# Patient Record
Sex: Female | Born: 1990 | Race: White | Hispanic: No | Marital: Single | Smoking: Never smoker
Health system: Southern US, Community
[De-identification: ages and names within clinical notes are randomized; demographics above are authoritative.]

## PROBLEM LIST (undated history)

## (undated) DIAGNOSIS — F419 Anxiety disorder, unspecified: Secondary | ICD-10-CM

## (undated) DIAGNOSIS — J383 Other diseases of vocal cords: Secondary | ICD-10-CM

## (undated) DIAGNOSIS — T7840XA Allergy, unspecified, initial encounter: Secondary | ICD-10-CM

## (undated) HISTORY — DX: Other diseases of vocal cords: J38.3

## (undated) HISTORY — DX: Anxiety disorder, unspecified: F41.9

## (undated) HISTORY — DX: Allergy, unspecified, initial encounter: T78.40XA

---
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
## 2011-07-06 ENCOUNTER — Encounter: Payer: Self-pay | Admitting: Family Medicine

## 2011-07-06 ENCOUNTER — Encounter (INDEPENDENT_AMBULATORY_CARE_PROVIDER_SITE_OTHER): Payer: PRIVATE HEALTH INSURANCE | Admitting: Family Medicine

## 2011-07-06 DIAGNOSIS — Z23 Encounter for immunization: Secondary | ICD-10-CM

## 2011-07-06 DIAGNOSIS — R5383 Other fatigue: Secondary | ICD-10-CM

## 2011-07-06 DIAGNOSIS — Z0000 Encounter for general adult medical examination without abnormal findings: Secondary | ICD-10-CM

## 2011-07-06 DIAGNOSIS — Z01419 Encounter for gynecological examination (general) (routine) without abnormal findings: Secondary | ICD-10-CM

## 2011-07-06 DIAGNOSIS — J301 Allergic rhinitis due to pollen: Secondary | ICD-10-CM

## 2011-07-10 ENCOUNTER — Other Ambulatory Visit (INDEPENDENT_AMBULATORY_CARE_PROVIDER_SITE_OTHER): Payer: PRIVATE HEALTH INSURANCE

## 2011-07-10 DIAGNOSIS — Z130 Encounter for screening for diseases of the blood and blood-forming organs and certain disorders involving the immune mechanism: Secondary | ICD-10-CM

## 2011-07-10 DIAGNOSIS — R5381 Other malaise: Secondary | ICD-10-CM

## 2011-07-10 DIAGNOSIS — R5383 Other fatigue: Secondary | ICD-10-CM

## 2011-09-04 ENCOUNTER — Ambulatory Visit (INDEPENDENT_AMBULATORY_CARE_PROVIDER_SITE_OTHER): Payer: PRIVATE HEALTH INSURANCE | Admitting: Physician Assistant

## 2011-09-04 DIAGNOSIS — Z23 Encounter for immunization: Secondary | ICD-10-CM

## 2011-09-04 NOTE — Progress Notes (Signed)
  Subjective:    Patient ID: Jasmine Barker, female    DOB: 19-Jan-1991, 21 y.o.   MRN: [id number]  HPI Here for Gardasil #2.   Review of Systems     Objective:   Physical Exam        Assessment & Plan:  RTC 4 months for Gardasil #3.

## 2011-12-10 ENCOUNTER — Ambulatory Visit (INDEPENDENT_AMBULATORY_CARE_PROVIDER_SITE_OTHER): Payer: PRIVATE HEALTH INSURANCE | Admitting: Internal Medicine

## 2011-12-10 ENCOUNTER — Encounter: Payer: Self-pay | Admitting: Internal Medicine

## 2011-12-10 DIAGNOSIS — Z23 Encounter for immunization: Secondary | ICD-10-CM

## 2011-12-10 MED ORDER — HPV QUADRIVALENT VACCINE IM SUSP
0.5000 mL | Freq: Once | INTRAMUSCULAR | Status: AC
  Administered 2011-12-10: 0.5 mL via INTRAMUSCULAR

## 2011-12-10 MED ORDER — HPV QUADRIVALENT VACCINE IM SUSP: 0.5000 mL | Freq: Once | INTRAMUSCULAR | Status: DC

## 2011-12-10 NOTE — Progress Notes (Signed)
Addended by: Glennie Isle on: 12/10/2011 06:01 PM   Modules accepted: Orders

## 2011-12-10 NOTE — Patient Instructions (Signed)
You have competed your immunization for gardasil

## 2011-12-10 NOTE — Progress Notes (Signed)
  Subjective:    Patient ID: Jasmine Barker, female    DOB: [birth year], 21 y.o.   MRN: [id number]  HPIneeds 3rd gardisil immunization No complaints. Is using condoms and birth control pills and practices safe sex    Review of Systems  Constitutional: Negative.   HENT: Negative.   Eyes: Negative.   Respiratory: Negative.   Cardiovascular: Negative.   Gastrointestinal: Negative.   Genitourinary: Negative.   Musculoskeletal: Negative.   Skin: Negative.   Neurological: Negative.   Hematological: Negative.   Psychiatric/Behavioral: Negative.   All other systems reviewed and are negative.       Objective:   Physical Exam  Nursing note and vitals reviewed. Constitutional: She is oriented to person, place, and time. She appears well-developed and well-nourished.  HENT:  Head: Normocephalic and atraumatic.  Right Ear: External ear normal.  Left Ear: External ear normal.  Eyes: Conjunctivae and EOM are normal. Pupils are equal, round, and reactive to light.  Neck: Normal range of motion. Neck supple.  Cardiovascular: Normal rate, regular rhythm and normal heart sounds.   Pulmonary/Chest: Effort normal and breath sounds normal.  Abdominal: Soft. Bowel sounds are normal.  Musculoskeletal: Normal range of motion.  Neurological: She is alert and oriented to person, place, and time. She has normal reflexes.  Skin: Skin is warm and dry.  Psychiatric: She has a normal mood and affect. Her behavior is normal. Judgment and thought content normal.      gardisil injection #3    Assessment & Plan:

## 2012-07-04 ENCOUNTER — Encounter: Payer: PRIVATE HEALTH INSURANCE | Admitting: Family Medicine

## 2012-08-12 ENCOUNTER — Telehealth: Payer: Self-pay | Admitting: Internal Medicine

## 2012-08-12 NOTE — Telephone Encounter (Signed)
Pt mother states she does not have a primary care doctor and to disregard.

## 2012-08-12 NOTE — Telephone Encounter (Signed)
We received results of recent allelrgy testing ,  But patient has not been seen by Dr Darrick Huntsman since the move August 2012.  Does she have another physician we can send the results to?

## 2012-09-16 ENCOUNTER — Encounter: Payer: Self-pay | Admitting: Family Medicine

## 2013-07-02 HISTORY — PX: MICROLARYNGOSCOPY: SHX5208

## 2013-12-28 ENCOUNTER — Ambulatory Visit (INDEPENDENT_AMBULATORY_CARE_PROVIDER_SITE_OTHER): Payer: BC Managed Care – PPO | Admitting: Physician Assistant

## 2013-12-28 VITALS — BP 102/68 | HR 73 | Temp 97.9°F | Resp 16 | Ht 63.0 in | Wt 111.0 lb

## 2013-12-28 DIAGNOSIS — R300 Dysuria: Secondary | ICD-10-CM

## 2013-12-28 DIAGNOSIS — J309 Allergic rhinitis, unspecified: Secondary | ICD-10-CM | POA: Insufficient documentation

## 2013-12-28 LAB — POCT UA - MICROSCOPIC ONLY
Casts, Ur, LPF, POC: NEGATIVE
Crystals, Ur, HPF, POC: NEGATIVE
Mucus, UA: NEGATIVE
Yeast, UA: POSITIVE

## 2013-12-28 LAB — POCT URINALYSIS DIPSTICK
Bilirubin, UA: NEGATIVE
Blood, UA: NEGATIVE
Glucose, UA: NEGATIVE
Ketones, UA: NEGATIVE
Nitrite, UA: NEGATIVE
Protein, UA: NEGATIVE
Spec Grav, UA: 1.01
Urobilinogen, UA: 0.2
pH, UA: 7

## 2013-12-28 MED ORDER — FLUCONAZOLE 150 MG PO TABS: 150.0000 mg | ORAL_TABLET | Freq: Once | ORAL | Status: DC

## 2013-12-28 NOTE — Patient Instructions (Signed)
I will contact you with your lab results as soon as they are available.   If you have not heard from me in 2 weeks, please contact me.  The fastest way to get your results is to register for My Chart (see the instructions on the last page of this printout).   

## 2013-12-28 NOTE — Progress Notes (Signed)
Subjective:    Patient ID: Jasmine Barker, female    DOB: 02/08/1991, 23 y.o.   MRN: [id number]   PCP: No primary provider on file.  Chief Complaint  Patient presents with  . Dysuria    2 weeks    Medications, allergies, past medical history, surgical history, family history, social history and problem list reviewed and updated.  Patient Active Problem List   Diagnosis Date Noted  . Allergic rhinitis 12/28/2013    Prior to Admission medications   Medication Sig Start Date End Date Taking? Authorizing Provider  Azelastine-Fluticasone (DYMISTA) 137-50 MCG/ACT SUSP Place into the nose.   Yes Historical Provider, MD    HPI  Presents with 2 weeks of urinary urgency, frequency and pressure.  Less burning than with previous UTIs she's had.  No vaginal discharge. No fever, chills, nausea, vomiting. Her mother is a physician and prescibed a course of Bactrim, but without benefit.  Recently underwent STI screening, as was her boyfriend.  All reportedly negative.  Review of Systems As above.    Objective:   Physical Exam  Constitutional: She is oriented to person, place, and time. She appears well-developed and well-nourished. No distress.  BP 102/68  Pulse 73  Temp(Src) 97.9 F (36.6 C)  Resp 16  Ht 5\' 3"  (1.6 m)  Wt 111 lb (50.349 kg)  BMI 19.67 kg/m2  SpO2 99%  LMP 12/07/2013   HENT:  Head: Normocephalic and atraumatic.  Eyes: Conjunctivae are normal. No scleral icterus.  Neck: Neck supple. No thyromegaly present.  Cardiovascular: Normal rate, regular rhythm and normal heart sounds.   Pulmonary/Chest: Effort normal and breath sounds normal.  Abdominal: Soft. Bowel sounds are normal. She exhibits no distension and no mass. There is no hepatosplenomegaly. There is no tenderness (describes the urge to urinate with palpation in the suprapubic region). There is no rebound, no guarding and no CVA tenderness.  Lymphadenopathy:    She has no cervical adenopathy.  Neurological:  She is alert and oriented to person, place, and time.  Skin: Skin is warm and dry.  Psychiatric: She has a normal mood and affect. Her behavior is normal.   Results for orders placed in visit on 12/28/13  POCT UA - MICROSCOPIC ONLY      Result Value Ref Range   WBC, Ur, HPF, POC 2-4     RBC, urine, microscopic 0-1     Bacteria, U Microscopic small     Mucus, UA neg     Epithelial cells, urine per micros 2-4     Crystals, Ur, HPF, POC neg     Casts, Ur, LPF, POC neg     Yeast, UA positive    POCT URINALYSIS DIPSTICK      Result Value Ref Range   Color, UA yellow     Clarity, UA clear     Glucose, UA neg     Bilirubin, UA neg     Ketones, UA neg     Spec Grav, UA 1.010     Blood, UA neg     pH, UA 7.0     Protein, UA neg     Urobilinogen, UA 0.2     Nitrite, UA neg     Leukocytes, UA Trace            Assessment & Plan:  1. Dysuria Likely vaginal candidiasis secondary to recent antibiotic use, but UCx pending.  Anticipatory guidance provided. Supportive care. - POCT UA - Microscopic Only - POCT  urinalysis dipstick - Urine culture - fluconazole (DIFLUCAN) 150 MG tablet; Take 1 tablet (150 mg total) by mouth once. Repeat if needed  Dispense: 2 tablet; Refill: 0   Fernande Brashelle S. Jeffery, PA-C Physician Assistant-Certified [facility], [facility]

## 2013-12-30 LAB — URINE CULTURE
Colony Count: NO GROWTH
Organism ID, Bacteria: NO GROWTH

## 2014-01-01 ENCOUNTER — Telehealth: Payer: Self-pay | Admitting: Physician Assistant

## 2014-01-01 NOTE — Telephone Encounter (Signed)
UCX is negative. If symptoms persist, let me know, otherwise, RTC PRN.

## 2014-01-22 ENCOUNTER — Encounter: Payer: Self-pay | Admitting: Family Medicine

## 2014-01-22 ENCOUNTER — Ambulatory Visit (INDEPENDENT_AMBULATORY_CARE_PROVIDER_SITE_OTHER): Payer: BC Managed Care – PPO | Admitting: Family Medicine

## 2014-01-22 VITALS — BP 105/76 | HR 82 | Temp 97.7°F | Resp 16 | Ht 63.5 in | Wt 108.0 lb

## 2014-01-22 DIAGNOSIS — N72 Inflammatory disease of cervix uteri: Secondary | ICD-10-CM

## 2014-01-22 DIAGNOSIS — N6320 Unspecified lump in the left breast, unspecified quadrant: Secondary | ICD-10-CM

## 2014-01-22 DIAGNOSIS — N6310 Unspecified lump in the right breast, unspecified quadrant: Secondary | ICD-10-CM | POA: Insufficient documentation

## 2014-01-22 DIAGNOSIS — N63 Unspecified lump in unspecified breast: Secondary | ICD-10-CM

## 2014-01-22 DIAGNOSIS — N898 Other specified noninflammatory disorders of vagina: Secondary | ICD-10-CM

## 2014-01-22 DIAGNOSIS — Z0000 Encounter for general adult medical examination without abnormal findings: Secondary | ICD-10-CM

## 2014-01-22 DIAGNOSIS — Z124 Encounter for screening for malignant neoplasm of cervix: Secondary | ICD-10-CM

## 2014-01-22 DIAGNOSIS — Z01419 Encounter for gynecological examination (general) (routine) without abnormal findings: Secondary | ICD-10-CM

## 2014-01-22 LAB — CBC WITH DIFFERENTIAL/PLATELET
Basophils Absolute: 0 10*3/uL (ref 0.0–0.1)
Basophils Relative: 1 % (ref 0–1)
Eosinophils Absolute: 0 10*3/uL (ref 0.0–0.7)
Eosinophils Relative: 1 % (ref 0–5)
HCT: 42.8 % (ref 36.0–46.0)
Hemoglobin: 15.2 g/dL — ABNORMAL HIGH (ref 12.0–15.0)
Lymphocytes Relative: 37 % (ref 12–46)
Lymphs Abs: 1.5 10*3/uL (ref 0.7–4.0)
MCH: 32.6 pg (ref 26.0–34.0)
MCHC: 35.5 g/dL (ref 30.0–36.0)
MCV: 91.8 fL (ref 78.0–100.0)
Monocytes Absolute: 0.3 10*3/uL (ref 0.1–1.0)
Monocytes Relative: 7 % (ref 3–12)
Neutro Abs: 2.2 10*3/uL (ref 1.7–7.7)
Neutrophils Relative %: 54 % (ref 43–77)
Platelets: 190 10*3/uL (ref 150–400)
RBC: 4.66 MIL/uL (ref 3.87–5.11)
RDW: 12.4 % (ref 11.5–15.5)
WBC: 4 10*3/uL (ref 4.0–10.5)

## 2014-01-22 LAB — POCT WET PREP WITH KOH
KOH Prep POC: NEGATIVE
Trichomonas, UA: NEGATIVE
Yeast Wet Prep HPF POC: NEGATIVE

## 2014-01-22 LAB — POCT URINALYSIS DIPSTICK
Bilirubin, UA: NEGATIVE
Blood, UA: NEGATIVE
Glucose, UA: NEGATIVE
Ketones, UA: NEGATIVE
Nitrite, UA: NEGATIVE
Protein, UA: NEGATIVE
Spec Grav, UA: 1.01
Urobilinogen, UA: 0.2
pH, UA: 7

## 2014-01-22 MED ORDER — METRONIDAZOLE 0.75 % VA GEL: 1.0000 | Freq: Two times a day (BID) | VAGINAL | Status: AC

## 2014-01-22 NOTE — Patient Instructions (Signed)
Keeping You Healthy  Get These Tests 1. Blood Pressure- Have your blood pressure checked once a year by your health care provider.  Normal blood pressure is 120/80. 2. Weight- Have your body mass index (BMI) calculated to screen for obesity.  BMI is measure of body fat based on height and weight.  You can also calculate your own BMI at www.nhlbisupport.com/bmi/. 3. Cholesterol- Have your cholesterol checked every 5 years starting at age 24 then yearly starting at age 45. 4. Chlamydia, HIV, and other sexually transmitted diseases- Get screened every year until age 25, then within three months of each new sexual provider. 5. Pap Smear- Every 1-3 years; discuss with your health care provider. 6. Mammogram- Every year starting at age 40  Take these medicines  Calcium with Vitamin D-Your body needs 1200 mg of Calcium each day and 800-1000 IU of Vitamin D daily.  Your body can only absorb 500 mg of Calcium at a time so Calcium must be taken in 2 or 3 divided doses throughout the day.  Multivitamin with folic acid- Once daily if it is possible for you to become pregnant.  Get these Immunizations  Gardasil-Series of three doses; prevents HPV related illness such as genital warts and cervical cancer.  Menactra-Single dose; prevents meningitis.  Tetanus shot- Every 10 years.  Flu shot-Every year.  Take these steps 1. Do not smoke-Your healthcare provider can help you quit.  For tips on how to quit go to www.smokefree.gov or call 1-800 QUITNOW. 2. Be physically active- Exercise 5 days a week for at least 30 minutes.  If you are not already physically active, start slow and gradually work up to 30 minutes of moderate physical activity.  Examples of moderate activity include walking briskly, dancing, swimming, bicycling, etc. 3. Breast Cancer- A self breast exam every month is important for early detection of breast cancer.  For more information and instruction on self breast exams, ask your  healthcare provider or www.womenshealth.gov/faq/breast-self-exam.cfm. 4. Eat a healthy diet- Eat a variety of healthy foods such as fruits, vegetables, whole grains, low fat milk, low fat cheeses, yogurt, lean meats, poultry and fish, beans, nuts, tofu, etc.  For more information go to www. Thenutritionsource.org 5. Drink alcohol in moderation- Limit alcohol intake to one drink or less per day. Never drink and drive. 6. Depression- Your emotional health is as important as your physical health.  If you're feeling down or losing interest in things you normally enjoy please talk to your healthcare provider about being screened for depression. 7. Dental visit- Brush and floss your teeth twice daily; visit your dentist twice a year. 8. Eye doctor- Get an eye exam at least every 2 years. 9. Helmet use- Always wear a helmet when riding a bicycle, motorcycle, rollerblading or skateboarding. 10. Safe sex- If you may be exposed to sexually transmitted infections, use a condom. 11. Seat belts- Seat belts can save your live; always wear one. 12. Smoke/Carbon Monoxide detectors- These detectors need to be installed on the appropriate level of your home. Replace batteries at least once a year. 13. Skin cancer- When out in the sun please cover up and use sunscreen 15 SPF or higher. 14. Violence- If anyone is threatening or hurting you, please tell your healthcare provider.  Bacterial Vaginosis Bacterial vaginosis is a vaginal infection that occurs when the normal balance of bacteria in the vagina is disrupted. It results from an overgrowth of certain bacteria. This is the most common vaginal infection in women of   childbearing age. Treatment is important to prevent complications, especially in pregnant women, as it can cause a premature delivery. CAUSES  Bacterial vaginosis is caused by an increase in harmful bacteria that are normally present in smaller amounts in the vagina. Several different kinds of bacteria can  cause bacterial vaginosis. However, the reason that the condition develops is not fully understood. RISK FACTORS Certain activities or behaviors can put you at an increased risk of developing bacterial vaginosis, including:  Having a new sex partner or multiple sex partners.  Douching.  Using an intrauterine device (IUD) for contraception. Women do not get bacterial vaginosis from toilet seats, bedding, swimming pools, or contact with objects around them. SIGNS AND SYMPTOMS  Some women with bacterial vaginosis have no signs or symptoms. Common symptoms include:  Grey vaginal discharge.  A fishlike odor with discharge, especially after sexual intercourse.  Itching or burning of the vagina and vulva.  Burning or pain with urination. DIAGNOSIS  Your health care provider will take a medical history and examine the vagina for signs of bacterial vaginosis. A sample of vaginal fluid may be taken. Your health care provider will look at this sample under a microscope to check for bacteria and abnormal cells. A vaginal pH test may also be done.  TREATMENT  Bacterial vaginosis may be treated with antibiotic medicines. These may be given in the form of a pill or a vaginal cream. A second round of antibiotics may be prescribed if the condition comes back after treatment.  HOME CARE INSTRUCTIONS   Only take over-the-counter or prescription medicines as directed by your health care provider.  If antibiotic medicine was prescribed, take it as directed. Make sure you finish it even if you start to feel better.  Do not have sex until treatment is completed.  Tell all sexual partners that you have a vaginal infection. They should see their health care provider and be treated if they have problems, such as a mild rash or itching.  Practice safe sex by using condoms and only having one sex partner. SEEK MEDICAL CARE IF:   Your symptoms are not improving after 3 days of treatment.  You have  increased discharge or pain.  You have a fever. MAKE SURE YOU:   Understand these instructions.  Will watch your condition.  Will get help right away if you are not doing well or get worse. FOR MORE INFORMATION  Centers for Disease Control and Prevention, Division of STD Prevention: www.cdc.gov/std American Sexual Health Association (ASHA): www.ashastd.org  Document Released: 06/18/2005 Document Revised: 04/08/2013 Document Reviewed: 01/28/2013 ExitCare Patient Information 2015 [employer]. This information is not intended to replace advice given to you by your health care provider. Make sure you discuss any questions you have with your health care provider.      

## 2014-01-22 NOTE — Progress Notes (Signed)
Subjective:    Patient ID: Jasmine Barker, female    DOB: [birth year], 23 y.o.   MRN: [id number]  HPI  This 23 y.o. Cauc female is here for CPE/PAP. She is accompanied by her mother (Dr. Mindi Junker) during interview. 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 Evaluated ~ 4 weeks ago at [facility]; treated for Candida vaginitis. Diflucan x 1 dose helped and pt received treatment from acupuncturist.   Menses are normal; pt is in heterosexual monogamous relationship. 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   Patient Active Problem List   Diagnosis Date Noted  . Allergic rhinitis 12/28/2013    Prior to Admission medications   Medication Sig Start Date End Date Taking? Authorizing Provider  Azelastine-Fluticasone (DYMISTA) 137-50 MCG/ACT SUSP Place into the nose.   Yes Historical Provider, MD    PMHx, Surg Hx, Soc and Fam Hx reviewed.   Review of Systems  Gastrointestinal: Positive for nausea.  Genitourinary: Positive for urgency, frequency and pelvic pain. Negative for dysuria, decreased urine volume, difficulty urinating, genital sores and menstrual problem.       Onset after vocal surgery 11/10/2013. Urethral and bladder discomfort after voiding. Sought treatment w/ acupuncturist after treatment for Candida vulvovaginitis (because problem did not feel like yeast infection); prescribed "Intestinal fungus formula' and  Acupuncture helps.  Skin: Positive for rash.  Allergic/Immunologic: Positive for environmental allergies.  All other systems reviewed and are negative.     Objective:   Physical Exam  Nursing note and vitals reviewed. Constitutional: She is oriented to person, place, and time. Vital signs are normal. She appears well-developed and well-nourished. No distress.  HENT:  Head: Normocephalic and atraumatic.  Right Ear: Hearing, tympanic membrane, external ear  and ear canal normal.  Left Ear: Hearing, tympanic membrane, external ear and ear canal normal.  Nose: Nose normal. No nasal deformity or septal deviation.  Mouth/Throat: Uvula is midline, oropharynx is clear and moist and mucous membranes are normal. No oral lesions. Normal dentition. No dental caries.  Eyes: Conjunctivae, EOM and lids are normal. Pupils are equal, round, and reactive to light. No scleral icterus.  Fundoscopic exam:      The right eye shows no papilledema.       The left eye shows no papilledema. The left eye shows red reflex.  Contacts in place.  Neck: Trachea normal, normal range of motion and full passive range of motion without pain. Neck supple. No spinous process tenderness and no muscular tenderness present. No mass and no thyromegaly present.  Cardiovascular: Normal rate, regular rhythm, S1 normal, S2 normal, normal heart sounds, intact distal pulses and normal pulses.   No extrasystoles are present. Exam reveals no gallop and no friction rub.   No murmur heard. Pulmonary/Chest: Effort normal and breath sounds normal. No respiratory distress. Right breast exhibits mass and tenderness. Right breast exhibits no inverted nipple, no nipple discharge and no skin change. Left breast exhibits mass. Left breast exhibits no inverted nipple, no nipple discharge, no skin change and no tenderness. Breasts are symmetrical.    Benign breast masses imaged in the past.  Abdominal: Soft. Normal appearance and bowel sounds are normal. She exhibits no distension and no mass. There is no tenderness. There is no rebound, no guarding and no CVA tenderness.  Genitourinary: Uterus normal. There is no rash, tenderness or lesion on the right labia. There is  no rash, tenderness or lesion on the left labia. Uterus is not deviated, not enlarged, not fixed and not tender. Cervix exhibits discharge and friability. Cervix exhibits no motion tenderness. Right adnexum displays no mass, no tenderness and no  fullness. Left adnexum displays tenderness. Left adnexum displays no mass and no fullness. There is erythema around the vagina. No tenderness or bleeding around the vagina. No signs of injury around the vagina.  Musculoskeletal:       Cervical back: Normal.       Thoracic back: Normal.       Lumbar back: Normal.  Remainder of exam unremarkable.  Lymphadenopathy:       Head (right side): No submental, no submandibular, no tonsillar, no posterior auricular and no occipital adenopathy present.       Head (left side): No submental, no submandibular, no tonsillar, no posterior auricular and no occipital adenopathy present.    She has no cervical adenopathy.    She has no axillary adenopathy.       Right: No inguinal adenopathy present.       Left: No inguinal adenopathy present.  Neurological: She is alert and oriented to person, place, and time. She has normal strength and normal reflexes. She displays no atrophy. No cranial nerve deficit or sensory deficit. She exhibits normal muscle tone. Coordination and gait normal.  Skin: Skin is warm, dry and intact. No ecchymosis, no lesion and no rash noted. She is not diaphoretic. No cyanosis or erythema. No pallor. Nails show no clubbing.  Psychiatric: She has a normal mood and affect. Her speech is normal and behavior is normal. Judgment and thought content normal. Cognition and memory are normal.    Results for orders placed in visit on 01/22/14  POCT WET PREP WITH KOH      Result Value Ref Range   Trichomonas, UA Negative     Clue Cells Wet Prep HPF POC tntc     Epithelial Wet Prep HPF POC tntc     Yeast Wet Prep HPF POC neg     Bacteria Wet Prep HPF POC 2+     RBC Wet Prep HPF POC 6-11     WBC Wet Prep HPF POC tntc     KOH Prep POC Negative    POCT URINALYSIS DIPSTICK      Result Value Ref Range   Color, UA yellow     Clarity, UA clear     Glucose, UA neg     Bilirubin, UA neg     Ketones, UA neg     Spec Grav, UA 1.010     Blood, UA  neg     pH, UA 7.0     Protein, UA neg     Urobilinogen, UA 0.2     Nitrite, UA neg     Leukocytes, UA small (1+)         Assessment & Plan:  Routine general medical examination at a health care facility - Plan: POCT urinalysis dipstick, Comprehensive metabolic panel, CBC with Differential, Vitamin D, 25-hydroxy, Thyroid Panel With TSH, Lipid panel  Encounter for cervical Pap smear with pelvic exam - Plan: Pap IG, CT/NG w/ reflex HPV when ASC-U  Cervicitis - Plan: Pap IG, CT/NG w/ reflex HPV when ASC-U, POCT Wet Prep with KOH  Vaginal discharge - Bacteria vaginosis treated w/ Metrogel vaginal gel to be used bid for 1 tube; may repeat if pelvic discomfort persists after next menses. Plan: POCT Wet Prep with KOH  Masses of both breasts- Per pt, these masses are stable and actually have decreased in size with gluten-free diet.  Meds ordered this encounter  Medications  . metroNIDAZOLE (METROGEL) 0.75 % vaginal gel    Sig: Place 1 Applicatorful vaginally 2 (two) times daily.    Dispense:  70 g    Refill:  1

## 2014-01-23 LAB — COMPREHENSIVE METABOLIC PANEL
ALT: 10 U/L (ref 0–35)
AST: 15 U/L (ref 0–37)
Albumin: 4.7 g/dL (ref 3.5–5.2)
Alkaline Phosphatase: 44 U/L (ref 39–117)
BUN: 7 mg/dL (ref 6–23)
CO2: 20 mEq/L (ref 19–32)
Calcium: 9.6 mg/dL (ref 8.4–10.5)
Chloride: 104 mEq/L (ref 96–112)
Creat: 0.62 mg/dL (ref 0.50–1.10)
Glucose, Bld: 74 mg/dL (ref 70–99)
Potassium: 4.7 mEq/L (ref 3.5–5.3)
Sodium: 137 mEq/L (ref 135–145)
Total Bilirubin: 1.9 mg/dL — ABNORMAL HIGH (ref 0.2–1.2)
Total Protein: 7.2 g/dL (ref 6.0–8.3)

## 2014-01-23 LAB — LIPID PANEL
Cholesterol: 164 mg/dL (ref 0–200)
HDL: 55 mg/dL (ref >39)
LDL Cholesterol: 98 mg/dL (ref 0–99)
Total CHOL/HDL Ratio: 3 Ratio
Triglycerides: 56 mg/dL (ref <150)
VLDL: 11 mg/dL (ref 0–40)

## 2014-01-23 LAB — THYROID PANEL WITH TSH
Free Thyroxine Index: 3.1 (ref 1.0–3.9)
T3 Uptake: 34.3 % (ref 22.5–37.0)
T4, Total: 9 ug/dL (ref 5.0–12.5)
TSH: 1.646 u[IU]/mL (ref 0.350–4.500)

## 2014-01-23 LAB — VITAMIN D 25 HYDROXY (VIT D DEFICIENCY, FRACTURES): Vit D, 25-Hydroxy: 36 ng/mL (ref 30–89)

## 2014-01-24 NOTE — Progress Notes (Signed)
Quick Note:  Please advise pt regarding following labs...  All labs results are normal except one of the liver function tests is a little above normal. Since I have no other results to compare this with, this may be a longstanding total bilirubin elevation. There is a hereditary condition called "Gilbert's Syndrome" which is a benign condition (absence of an enzyme) that results in an increased bilirubin. No treatment is necessary and fasting causes intermittent increases in the bilirubin level.  Take care and contact the clinic if you have any questions or concerns.  Copy to pt. ______

## 2014-01-25 LAB — PAP IG, CT-NG, RFX HPV ASCU
Chlamydia Probe Amp: NEGATIVE
GC Probe Amp: NEGATIVE

## 2014-01-26 NOTE — Progress Notes (Signed)
Quick Note:  Notify pt of Normal results. ______ 

## 2014-01-27 ENCOUNTER — Encounter: Payer: Self-pay | Admitting: Family Medicine

## 2014-01-27 ENCOUNTER — Telehealth: Payer: Self-pay | Admitting: *Deleted

## 2014-01-27 NOTE — Telephone Encounter (Signed)
Pt called back in regards to lab results. Informed her of results. She expressed understanding.

## 2014-05-03 ENCOUNTER — Telehealth: Payer: Self-pay | Admitting: Otolaryngology

## 2014-05-03 NOTE — Telephone Encounter (Signed)
Pt is calling because she recently had vocal chord surgery in her home town and her vocal teacher her recommended she see Dr. Ernestine McmurrayIngle for follow up.  Please call her back to schedule at [phone]

## 2014-05-04 NOTE — Telephone Encounter (Signed)
LVM for pt to call back to schedule.

## 2014-05-11 NOTE — Telephone Encounter (Signed)
Spoke with pt and rescheduled appt for 11/11

## 2014-05-11 NOTE — Telephone Encounter (Signed)
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

## 2014-05-12 ENCOUNTER — Encounter: Payer: Self-pay | Admitting: Otolaryngology

## 2014-05-12 ENCOUNTER — Ambulatory Visit: Payer: Self-pay | Admitting: Otolaryngology

## 2014-05-12 VITALS — BP 117/77 | HR 82 | Ht 63.0 in | Wt 110.0 lb

## 2014-05-12 DIAGNOSIS — J383 Other diseases of vocal cords: Secondary | ICD-10-CM

## 2014-05-12 DIAGNOSIS — R490 Dysphonia: Secondary | ICD-10-CM | POA: Insufficient documentation

## 2014-05-12 DIAGNOSIS — J309 Allergic rhinitis, unspecified: Secondary | ICD-10-CM

## 2014-05-12 MED ORDER — FLUTICASONE PROPIONATE 50 MCG/ACT NA SUSP *I*
2.0000 | Freq: Every day | NASAL | Status: AC
Start: 2014-05-12 — End: ?

## 2014-06-03 ENCOUNTER — Ambulatory Visit: Payer: Self-pay | Admitting: Otolaryngology

## 2014-06-04 ENCOUNTER — Ambulatory Visit: Payer: Self-pay

## 2014-06-04 DIAGNOSIS — R490 Dysphonia: Secondary | ICD-10-CM

## 2014-06-04 DIAGNOSIS — J309 Allergic rhinitis, unspecified: Secondary | ICD-10-CM

## 2014-06-04 DIAGNOSIS — J383 Other diseases of vocal cords: Secondary | ICD-10-CM

## 2014-06-04 NOTE — Progress Notes (Signed)
VOICE EVALUATION    Patient Name: Tanya Burton  Date of Exam: 06/04/2014  Date of Birth: Sep 21, 1990  Patient MRN: [id number]  Referring Physician:  Diamond NickelIngle, John W, MD   Diagnosis: Dysphonia, lesion of true vocal fold, allergic rhinitis  Start of Care Date: 06/04/2014    Tanya Burton is a 23 year old female seen today for voice evaluation due to voice changes.  She reports that symptoms began with a severe URI; she reports that most symptoms related to the illness have resolved, but her voice is not back to normal.  She reports that nothing seems to improve her voice, and that using it for longer periods of time and with increased loudness/effort makes her voice worse.  She has notices that she cannot access her upper pitch range, and that if she tries to use higher pitches her voice will hurt.  She describes her voice as rougher than normal, with intermittent pain with use.      She denies difficulty swallowing, dyspnea, or coughing.  She denies LPR-related symptoms.  She reports seasonal sinus symptoms.  She denies pulmonary or neurological conditions.    She is currently a Gaffergraduate student studying music.  She reports heavy voice use related to her studies.  She drinks 60-80 oz of water daily, and rarely consumes caffeinated or carbonated drinks.  She drinks alcohol socially (avg 2 drinks per week).  She denies history of tobacco use.    The patient was evaluated via laryngoscopy and videostroboscopy by laryngologist Dr. Dahlia ByesJohn Ingle.  His evaluation revealed mild bilateral vocal fold lesions, and signs/symptoms related to allergic rhinitis.  He prescribed fluticasone nasal spray.    HISTORY:  Current Outpatient Prescriptions   Medication    fluticasone (FLONASE) 50 MCG/ACT nasal spray    sertraline (ZOLOFT) 25 MG tablet     No current facility-administered medications for this visit.     No past medical history on file.  Oral Motor Exam :  WNL    CAPE-V    DATE 06/04/2014      Overall Rating 18/100      Roughness 15/100       Breathiness 0/100      Strain 20/100 intermittent      Pitch 0/100      Loudness 0/100      Comment -Resonance normal      Comment -  Other         Aerodynamic Measures     06/04/2014   NORM NORM-PED   Vital Capacity 3.29 L   3.08 L       Phonatory Aerodynamic System   06/04/2014     NORM   Mean Peak Air Pressure 7.36 cm H2O     5.57            Mean Airflow Rate 0.15 L/sec     0.11            Aerodynamic  Resistance 47.32     55.18                         ACOUSTIC MEASURES-MDSVP  DATE 06/04/2014       Fo 232       High 235       Low 227       Pitch RANGE 152-656       Modulate H>L No break       Modulate L>H No break       Jitter %  0.552       Shimmer % 3.454       NHR 0.077                 NORMS FOR ACOUSTIC DATA CSL MDVP and VISIPITCH MDVP   NORM-F NORM-M NORM-PED-F  Age 33-12 NORM-PED-M  Age 42-12   Fo 25 145 79 281   High 252 150 297 288   Low 234 140 237 226   Pitch Range 130-750 75-500     RAP 0.378 0.345     Jitter 0.633      Shimmer 1.997 2.219     Voice Turbulance Index 0.112 0.122     Noise To Harmonic Ration 0.046 0.052       Impression/Recommendations  Tanya Burton demonstrates a mild dysphonia characterized by increased roughness and strain in vocal quality, and reports of intermittent pain with voice use.  Aerodynamic measures indicate increased subglottic pressure and transglottal airflow, which suggests inefficient laryngeal valving of airflow and compensatory hyperfunction of the larynx.  Results are consistent with findings during evaluation by Dr. Dahlia Byes which revealed mild vocal fold lesions and allergic rhinitis.    Plan of Care  Tanya Burton requires targeted therapy 1X per week for 6 weeks  to address the following in order to regain and maintain functional verbal communication across environments.    Control of respiration for phonatory tasks, i.e. appropriate breathing for speech and breath control of volume.   STG: Patient will demonstrate ability to identify pressed vocal function without  cuing for 90% of occurrences in therapy setting   STG: Patient will demonstrate diaphragmatic breathing accurately 100% in therapy setting  Stretching/massage to relieve laryngeal tension due to hyperfunction as compensatory strategy.  Resonant therapy to improve head resonance, creating a healthy voice while eliminating overuse/abuse of TVF by rebalancing muscles, joints and ligament used in voice production.   STG: Patient will produce forward placement of tone as evidenced by vibrations of front of face with the sounds m/n/z/v/l/r in therapy setting.  STG: Patient will accurately place tone to criterion set by clinician with minimal cue (gesture) 100% of the time in therapy setting       Vocal Function Exercises to strengthen and exercises laryngeal muscles              STG: Patient will be educated in VFE and demonstrate proper technique to perform exercises safely  STG: Patient will maintain phonation for > 5 seconds during exercise       Semi Occluded Vocal Tract exercises to facilitate the motor pattern     development and balance sub and superglottic air pressures to achieve ideal      configuration of vocal cords during phonation.   STG: Patient will be educated in SOVT and demonstrate proper technique to perform exercises safely   STG: Patient will demonstrate improved vocal quality for 5 minutes post SOVT exercise.   Vocal Wellness   STG: Patient will be educated re: Higher education careers adviser and verbally report practice of healthy voice at start of therapy session      STG: Patient will be educated about lifestyle changes and dietary changes to                   reduce/eliminate reflux.  Patient will report practice of reflux reducing lifestyle              changes at start of therapy session  Intervention Provided    Patient was taught diaphragmatic breathing techniques , via videos of anatomical / physiologic diaphragmatic breathing model, clinician education and modeling.  and was able to achieve  appropriate pattern after several trials.     Patient was educated in the use of appropriate breath support and use of diaphragm to control volume on the phoneme /ah/ and accent method training with /sh/    Patient was educated verbally and in writing regarding  breathing/voicing using printed anatomical diagrams of the respiratory system and the larynx.    Patient was educated the achievement and use of head resonance using the mmm-hum technique.           Patient was educated about vocal hygiene issues including appropriate hydration    Patient was educated verbally and through demonstration and practice, semi-occluded vocal tract exercises             Tanya Saxmily Samarin was able to return education through verbal expression of understanding and demonstration of techniques.      If you have any questions about this evaluation or therapy plan, please do not hesitate to call me at [phone].      Thank you for this referral,  Bethann PunchesLiane Kyung Burton, M.M., M.S., Tanya Burton  Speech-Language Pathologist

## 2014-07-12 ENCOUNTER — Telehealth: Payer: Self-pay

## 2014-07-12 NOTE — Telephone Encounter (Signed)
Patient would like to cancel her speech therapy appointment on 1/15. She will call us back to reschedule.

## 2014-07-16 ENCOUNTER — Ambulatory Visit: Payer: Self-pay

## 2014-08-06 ENCOUNTER — Ambulatory Visit
Admit: 2014-08-06 | Discharge: 2014-08-06 | Disposition: A | Discharge status: Home / Self Care | Payer: Self-pay | Source: Ambulatory Visit | Attending: Internal Medicine | Admitting: Internal Medicine

## 2014-08-06 LAB — BASIC METABOLIC PANEL
Anion Gap: 13 (ref 7–16)
CO2: 27 mmol/L (ref 20–28)
Calcium: 8.6 mg/dL — ABNORMAL LOW (ref 9.0–10.4)
Chloride: 101 mmol/L (ref 96–108)
Creatinine: 0.69 mg/dL (ref 0.51–0.95)
GFR,Black: 141 *
GFR,Caucasian: 122 *
Glucose: 75 mg/dL (ref 60–99)
Lab: 9 mg/dL (ref 6–20)
Potassium: 4.3 mmol/L (ref 3.3–5.1)
Sodium: 141 mmol/L (ref 133–145)

## 2014-08-06 LAB — LIPID PANEL
Chol/HDL Ratio: 2.8
Cholesterol: 189 mg/dL
HDL: 67 mg/dL
LDL Calculated: 112 mg/dL
Non HDL Cholesterol: 122 mg/dL
Triglycerides: 50 mg/dL

## 2014-08-06 LAB — CBC
Hematocrit: 44 % (ref 34–45)
Hemoglobin: 15.2 g/dL (ref 11.2–15.7)
MCH: 34 pg/cell — ABNORMAL HIGH (ref 26–32)
MCHC: 35 g/dL (ref 32–36)
MCV: 97 fL — ABNORMAL HIGH (ref 79–95)
Platelets: 205 10*3/uL (ref 160–370)
RBC: 4.5 MIL/uL (ref 3.9–5.2)
RDW: 11.2 % — ABNORMAL LOW (ref 11.7–14.4)
WBC: 4.9 10*3/uL (ref 4.0–10.0)

## 2014-08-06 LAB — SEDIMENTATION RATE, AUTOMATED: Sedimentation Rate: 4 mm/hr (ref 0–20)

## 2014-08-06 LAB — TSH: TSH: 1.98 u[IU]/mL (ref 0.27–4.20)

## 2014-08-09 LAB — VITAMIN B12: Vitamin B12: 417 pg/mL (ref 211–946)

## 2014-08-10 ENCOUNTER — Encounter: Payer: Self-pay | Admitting: Otolaryngology

## 2014-08-10 NOTE — Procedures (Signed)
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    Verbal consent for the procedure was obtained from the patient.   While seated, the patient's nasal cavity was topically anesthetized. After adequate anesthesia was obtained, the scope was passed and the nasal cavity, nasopharynx, oropharynx, hypopharynx, and larynx were examined.     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

## 2014-08-10 NOTE — Progress Notes (Signed)
Presenting Concerns:  Hoarse voice, worsening recently    History of Present Illness:  Tanya Burton is a 24 y.o. female who was referred to the [facility] for with worsening of voice.  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 Lenard Simmer.  She had voice therapy postoperatively and felt that her voice is quite good.  She feels that her voice has been worsening recently.  Her voice is temporarily better if she uses a facial steamer.  Nothing that she has identified makes her voice worse.   She feels strain and intermittent pain with talking and with singing.  She feels that it can be effortful to talk.  She reports she is prone to anxiety and is currently taking Zoloft.    She reports that symptoms began with a severe URI; she reports that most symptoms related to the illness have resolved, but her voice is not back to normal. She reports that nothing seems to improve her voice, and that using it for longer periods of time and with increased loudness/effort makes her voice worse. She has notices that she cannot access her upper pitch range, and that if she tries to use higher pitches her voice will hurt. She describes her voice as rougher than normal, with intermittent pain with use.     She denies difficulty swallowing, dyspnea, or coughing. She denies LPR-related symptoms.  She denies any heartburn, nausea, stomach pain, sensation of a lump in her throat or something stuck in her throat.  She does have some throat clearing related to mucus in her throat.  She feels that her throat is dry and she is trying hard not to clear her throat.  She denies any throat pain but does feel a sensation of pressure in her throat. She reports seasonal sinus symptoms. She denies pulmonary or neurological conditions.    She does have a sensation of postnasal drip and mucus in her throat.  She denies any nasal congestion, runny nose, itchy nose, watery eyes, sinus infections,  nosebleeds, previous nasal surgery.  She typically has some mild seasonal allergies in the fall and uses fluticasone nasal spray during that time.  She denies any history of asthma or lung condition.    She is currently a first year [employer] studying music.  She is a Clinical cytogeneticist and sings classical music.  She studies with Rich [employer]uchs.  Her voice demands are high. She reports heavy voice use related to her studies. She drinks 80 oz of water daily, and rarely consumes caffeinated or carbonated drinks. She drinks alcohol socially (avg 2 drinks per week). She denies history of tobacco use.    Laryngology-related quality of life measures at today's visit:  VHI-10: 17  RSI: 18  DI: 0  CSI: 0  EAT-10: 0  SVHI-10: 14    After obtaining the history of present illness, a voice evaluation by speech language pathology was indicated at this visit, given the nature of the patient's problem as noted above.      Review of Systems:  ROS per patient questionnaire and per patient.  Reviewed and confirmed.  Details include:  Positive for hoarse voice, mucus in her throat, postnasal drip, seasonal allergies in the fall, intermittent nonproductive coughing.  All other systems negative.     Past Medical, Surgical, Family, Social History:  PFSH per patient questionnaire and per patient.  Reviewed and confirmed.  Details include:  Medical history significant for anxiety and  a previous vocal cord polyp.  Past surgical history significant for vocal fold [facility] with Dr. Lenard SimmerSeth Cohen.  Family history significant for anxiety, cancer, heart failure, heart disease, hypertension.  Refer to HPI for full social history.    Physical Exam:  Constitutional: appears comfortable, no apparent distress.   Communication Assessment: voice with mild hoarseness, no breathiness, articulation clear.   Head: normocephalic, atraumatic, no asymmetry.   Eyes: pupils equal, round and reactive to light and  accommodation. Extraocular movements intact, no gaze asymmetry.   Ears: normal external appearance of the pinnae, ear canals patent, tympanic membranes intact, no evidence of middle ear effusion bilaterally.    Nose: normal external appearance, septum fairly midline, nasal passages patent bilaterally, turbinates appear normal, no evidence of obvious nasal polyps, nasal mucosa normal.  Oral Cavity: no obvious oral mucosal lesions, normal tongue mobility, gingiva healthy.  Pharynx: no erythema or exudates, no suspicious lesions, normal palate elevation with ah.  Neck: supple, symmetric and free of any obvious masses or tenderness to palpation, trachea and larynx midline, thyroid gland normal to palpation, normal laryngeal mobility to manipulation, no evidence of strap muscle tenderness.  Lymphatic: no evidence of cervical lymphadenopathy to palpation.   Respiratory: respirations non-labored, breathing comfortably, no tachypnea, no stridor or obvious wheezing.   Cardiovascular: no extremity edema, no jugular venous distension.  Musculoskeletal: no obvious deformity of the extremities, gait normal.   Skin: warm, pink, and dry, no obvious rashes of the head and neck.   Neurologic:   Cranial nerves II through XII grossly intact bilaterally.  Psychiatric: normal mood and affect.     After review of the patients chief complaint, history and physical exam findings, the flexible laryngoscopy with stroboscopy procedure was indicated at this visit for a complete evaluation.  Please see separate procedure note.     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  The lesion is benign appearing.  All these are new diagnoses at this visit.  The primary treatment of the patient's voice condition is speaking voice therapy.    The patient has intranasal findings and symptoms that may  be consistent with allergic rhinitis, which is a new diagnosis at this visit.  I recommended first-line treatment with a topical nasal steroid spray once daily.  I also recommended nasal saline irrigations twice daily.    As for the mucus in her throat I feel that she would benefit from using over-the-counter extra strength Mucinex.    Recommendations:  Referral to speech language pathology for speaking voice therapy.  I prescribed fluticasone nasal spray, 2 sprays in each nostril, once daily in the morning.  I have also recommended once daily nasal saline irrigations in the evening.  Over-the-counter Mucinex, as needed for mucus in her throat.  Return to the [facility] in 2 months, or sooner if needed.     It is my pleasure to participate in the care of Tanya Burton.    Sincerely, Donato SchultzJohn    Tanya Kuhnert W. Ernestine McmurrayIngle, MD  Assistant Professor, Laryngology - Voice, Airway, and Swallowing Disorders  Director, [facility]  Department of Otolaryngology  [facility]  Clinic Phone [phone]  Clinic Fax [phone]

## 2014-09-22 ENCOUNTER — Encounter: Payer: Self-pay | Admitting: Otolaryngology

## 2014-09-22 ENCOUNTER — Ambulatory Visit: Payer: Self-pay | Admitting: Otolaryngology

## 2014-09-22 VITALS — BP 139/73 | HR 70 | Temp 98.1°F | Ht 63.0 in | Wt 110.0 lb

## 2014-09-22 DIAGNOSIS — R490 Dysphonia: Secondary | ICD-10-CM

## 2014-09-22 DIAGNOSIS — J309 Allergic rhinitis, unspecified: Secondary | ICD-10-CM

## 2014-09-22 DIAGNOSIS — J383 Other diseases of vocal cords: Secondary | ICD-10-CM

## 2014-09-22 NOTE — Procedures (Signed)
Tanya SaxEmily Burton DOB: [birth year] Date of Service: 09/22/2014     Physician: Diamond NickelJohn W Taevon Aschoff, MD     Strobovideolaryngoscopy flexible [phone]([id number])    Pre- Procedure Diagnosis: dysphonia and right mid-/posterior true vocal fold subepithelial fullness, allergic rhinitis, patient has reported vocal spasms    Post-Procedure Diagnosis: same as pre-op    No evidence of a neurologic condition of the larynx, no evidence of central tremor of the larynx, no evidence of spasmodic dysphonia.  No evidence of inflammatory or infectious pathology.  Normal bilateral true vocal fold motion and function.    Verbal consent for the procedure was obtained from the patient.   While seated, the patient's nasal cavity was topically anesthetized. After adequate anesthesia was obtained, the scope was passed and the nasal cavity, nasopharynx, oropharynx, hypopharynx, and larynx were examined.     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

## 2014-09-22 NOTE — Patient Instructions (Addendum)
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  The lesion is benign appearing and not having a significant impact upon her voice function.  All these are unchanged at this visit.  She completed speaking voice therapy.    She presents today with a new concern for vocal spasms.  There is no evidence of any neurologic disorder of her larynx or head and neck on a thorough examination.  There is no evidence of any inflammatory or infectious pathology of the head and neck on a thorough examination.  I feel that her vocal spasms are very intermittent with no specific pattern.  She was unable to trigger a vocal spasm during her vocal tasks, during examination.  I feel the problem is likely related to a normal normal maturation process in her voice as she has had a significant expansion in her vocal range during the past year.  I encouraged the patient that no concerning condition was ongoing and she should continue to work with her Metallurgistvocal instructor.  I asked her return to the clinic if she had any worsening in her condition, increasing frequency, or persistence of her symptoms.    The patient has intranasal findings and symptoms that may be consistent with allergic rhinitis, which is unchanged at this visit.  I recommended continued first-line treatment with a topical nasal steroid spray once daily.  I also recommended continued nasal saline irrigations twice daily.  She may continue to use Mucinex as needed.    Recommendations:  Continue fluticasone nasal spray, 2 sprays in each nostril, once daily in the morning.  Continue once daily nasal saline irrigations in the evening.  Over-the-counter Mucinex, as needed for mucus in her throat.  I recommended saline gel application to the nose nightly to reduce nasal dryness.  Return to the [facility] Center as needed.    It is my  pleasure to participate in the care of [name].    Sincerely, Donato SchultzJohn    Shaquasha Gerstel W. Ernestine McmurrayIngle, MD  Assistant Professor, Laryngology - Voice, Airway, and Swallowing Disorders  Director, [facility]  Department of Otolaryngology  [facility]  Clinic Phone [phone]  Clinic Fax [phone]

## 2014-09-22 NOTE — Progress Notes (Signed)
Presenting Concerns:  New complaint of vocal spasms ongoing for 3 weeks    History of Present Illness:  Tanya Burton is a 24 y.o. 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  She reports that 3 weeks ago she suddenly developed "a weird vocal spasm" which occurs sometimes when she is singing.  She reports that the spasm seems to occur in her passagio area.  She reports that the spasm tends to occur while she is warming up.  She reports that she also feels a "catch" in her speaking voice sometimes.  She reports that the spasm is not painful, but feels like a "muscle tweak".  She denies any recent illnesses.  She denies having any vocal hoarseness.  She denies any recent changes in her vocal workload.  She reports that she has not noticed any increased allergy symptoms recently.  She reports that using a facial steamer and drinking more water provide some relief.  She reports that she has been taking Zoloft 25 mg and wonders if this is causing increased vocal dryness.  She also reports that she has been trying to gain weight and may have been having more reflux recently.  She reports that her voice teacher does not have any concerns about her voice.  She reports making "huge technical strides in [her vocal] range" this year.    Laryngology-related quality of life measures at today's visit:  VHI-10: 15  RSI: 11  DI: 0  CSI: 0  EAT-10: 0  SVHI-10: 16     Recent History:  She was first referred for worsening of voice.  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 Lenard Simmer.  She had voice therapy postoperatively and felt that her voice is quite good.  She feels that her voice has been worsening recently.  Her voice is temporarily better if she uses a facial steamer.  Nothing that she has identified makes her voice worse.   She  feels strain and intermittent pain with talking and with singing.  She feels that it can be effortful to talk.  She reports she is prone to anxiety and is currently taking Zoloft.    She reports that symptoms began with a severe URI; she reports that most symptoms related to the illness have resolved, but her voice is not back to normal. She reports that nothing seems to improve her voice, and that using it for longer periods of time and with increased loudness/effort makes her voice worse. She has notices that she cannot access her upper pitch range, and that if she tries to use higher pitches her voice will hurt. She describes her voice as rougher than normal, with intermittent pain with use.     She denies difficulty swallowing, dyspnea, or coughing. She denies LPR-related symptoms.  She denies any heartburn, nausea, stomach pain, sensation of a lump in her throat or something stuck in her throat.  She does have some throat clearing related to mucus in her throat.  She feels that her throat is dry and she is trying hard not to clear her throat.  She denies any throat pain but does feel a sensation of pressure in her throat. She reports seasonal sinus symptoms. She denies pulmonary or neurological conditions.    She does have a sensation of postnasal drip and mucus in  her throat.  She denies any nasal congestion, runny nose, itchy nose, watery eyes, sinus infections, nosebleeds, previous nasal surgery.  She typically has some mild seasonal allergies in the fall and uses fluticasone nasal spray during that time.  She denies any history of asthma or lung condition.    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  She studies with Rich FuchsKatie Kowdrick.  Her voice demands are high. She reports heavy voice use related to her studies. She drinks 80 oz of water daily, and rarely consumes caffeinated or carbonated drinks. She drinks alcohol socially (avg 2  drinks per week). She denies history of tobacco use.    Review of Systems:  ROS per patient questionnaire and per patient.  Reviewed and confirmed.  Details include:  Positive for hoarse voice, mucus in her throat, postnasal drip, seasonal allergies in the fall, intermittent nonproductive coughing.  All other systems negative.     Past Medical, Surgical, Family, Social History:  PFSH per patient questionnaire and per patient.  Reviewed and confirmed.  Details include:  Medical history significant for anxiety and a previous vocal cord polyp.  Past surgical history significant for vocal fold surgery with excision of the polyp at [facility] with Dr. Lenard SimmerSeth Cohen.  Family history significant for anxiety, cancer, heart failure, heart disease, hypertension.  Refer to HPI for full social history.    Physical Exam:  Constitutional: appears comfortable, no apparent distress.   Communication Assessment: voice with mild hoarseness, no breathiness, articulation clear.   Head: normocephalic, atraumatic, no asymmetry.   Eyes: pupils equal, round and reactive to light and accommodation. Extraocular movements intact, no gaze asymmetry.   Ears: normal external appearance of the pinnae, ear canals patent, tympanic membranes intact, no evidence of middle ear effusion bilaterally.    Nose: normal external appearance, septum fairly midline, nasal passages patent bilaterally with crusted blood anteriorly, turbinates appear normal, no evidence of obvious nasal polyps, nasal mucosa normal.  Oral Cavity: no obvious oral mucosal lesions, normal tongue mobility, gingiva healthy.  Pharynx: no erythema or exudates, no suspicious lesions, normal palate elevation with ah.  Neck: supple, symmetric and free of any obvious masses or tenderness to palpation, trachea and larynx midline, thyroid gland normal to palpation, normal laryngeal mobility to manipulation, no evidence of strap muscle tenderness.  Lymphatic: no evidence of cervical  lymphadenopathy to palpation.   Respiratory: respirations non-labored, breathing comfortably, no tachypnea, no stridor or obvious wheezing.   Cardiovascular: no extremity edema, no jugular venous distension.  Musculoskeletal: no obvious deformity of the extremities, gait normal.   Skin: warm, pink, and dry, no obvious rashes of the head and neck.   Neurologic:   Cranial nerves II through XII grossly intact bilaterally.  Psychiatric: normal mood and affect.     After review of the patients chief complaint, history and physical exam findings, the flexible laryngoscopy with stroboscopy procedure was indicated at this visit for a complete evaluation.  Please see separate procedure note.     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  The lesion is benign appearing and not having a significant impact upon her voice function.  All these are unchanged at this visit.  She completed speaking voice therapy.    She presents today with a new concern for vocal spasms.  There is  no evidence of any neurologic disorder of her larynx or head and neck on a thorough examination.  There is no evidence of any inflammatory or infectious pathology of the head and neck on a thorough examination.  I feel that her vocal spasms are very intermittent with no specific pattern.  She was unable to trigger a vocal spasm during her vocal tasks, during examination.  I feel the problem is likely related to a normal normal maturation process in her voice as she has had a significant expansion in her vocal range during the past year.  I encouraged the patient that no concerning condition was ongoing and she should continue to work with her Metallurgist.  I asked her return to the clinic if she had any worsening in her condition, increasing frequency, or persistence of her symptoms.    The patient  has intranasal findings and symptoms that may be consistent with allergic rhinitis, which is unchanged at this visit.  I recommended continued first-line treatment with a topical nasal steroid spray once daily.  I also recommended continued nasal saline irrigations twice daily.  She may continue to use Mucinex as needed.    Recommendations:  Continue fluticasone nasal spray, 2 sprays in each nostril, once daily in the morning.  Continue once daily nasal saline irrigations in the evening.  Over-the-counter Mucinex, as needed for mucus in her throat.  I recommended saline gel application to the nose nightly to reduce nasal dryness.  Return to the Voice Center as needed.    It is my pleasure to participate in the care of Manilla Strieter.    Sincerely, Donato Schultz. Ernestine Mcmurray, MD  Assistant Professor, Laryngology - Voice, Airway, and Swallowing Disorders  Director, [facility]  Department of Otolaryngology  UR Medicine  Clinic Phone [phone]  Clinic Fax [phone]      Attestations:  Documented by Emeline Darling acting as a scribe for Dr. Ellin Mayhew. [place]o  09/22/2014  9:28 AM     All medical record entries made by the Scribe were at my direction and personally dictated by me. I have reviewed the chart and agree that the record accurately reflects my personal performance of the history, physical exam, assessment and plan. I have also personally directed, reviewed, and agree with the discharge instructions.  09/22/2014  9:28 AM

## 2014-09-23 ENCOUNTER — Encounter: Payer: Self-pay | Admitting: Otolaryngology

## 2014-11-11 ENCOUNTER — Telehealth: Payer: Self-pay | Admitting: Otolaryngology

## 2014-11-11 NOTE — Telephone Encounter (Signed)
Tanya Burton is calling to request that a copy of the strobovideolaryngoscopy of 3/23 be sent to Dr. Adelene IdlerLeda Scearce, [facility], [address], [address], [place], [place] [address].  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 Ernestine McmurrayIngle when in [place], but also treated by Dr. Darl House[place]cearce [place].  If there are any questions, Tanya Burton can be reached at [phone].

## 2014-11-12 NOTE — Telephone Encounter (Signed)
I tried calling patient but her mailbox is full. A Release of Information form is needed in order to send records to another provider.

## 2015-03-24 ENCOUNTER — Telehealth: Payer: Self-pay

## 2015-03-24 NOTE — Telephone Encounter (Signed)
Tanya Burton and wanted to schedule an appointment with Dr. Ernestine Mcmurray ad I offered first available appointment 12/28. She did not want to wait that long. I offered Dr. Marcha Dutton and she did not want too see him. She asked if she could email you and I said I would send you a message. Tanya Burton would like you to call her at [phone].

## 2015-04-01 NOTE — Telephone Encounter (Signed)
Thank you :)

## 2015-04-01 NOTE — Telephone Encounter (Signed)
Sp w/ pt sched 10/5 at 8am

## 2015-04-06 ENCOUNTER — Ambulatory Visit: Payer: Self-pay | Admitting: Otolaryngology

## 2015-04-22 ENCOUNTER — Ambulatory Visit
Admission: RE | Admit: 2015-04-22 | Discharge: 2015-04-22 | Disposition: A | Discharge status: Home / Self Care | Payer: Self-pay | Source: Ambulatory Visit | Attending: Internal Medicine | Admitting: Internal Medicine

## 2015-04-22 LAB — VITAMIN B12: Vitamin B12: 1060 pg/mL — ABNORMAL HIGH (ref 211–946)

## 2015-04-22 LAB — CBC AND DIFFERENTIAL
Baso # K/uL: 0 10*3/uL (ref 0.0–0.1)
Basophil %: 0.8 %
Eos # K/uL: 0 10*3/uL (ref 0.0–0.4)
Eosinophil %: 1.1 %
Hematocrit: 44 % (ref 34–45)
Hemoglobin: 14.4 g/dL (ref 11.2–15.7)
IMM Granulocytes #: 0 10*3/uL (ref 0.0–0.1)
IMM Granulocytes: 0 %
Lymph # K/uL: 1.2 10*3/uL (ref 1.2–3.7)
Lymphocyte %: 32.9 %
MCH: 33 pg/cell — ABNORMAL HIGH (ref 26–32)
MCHC: 33 g/dL (ref 32–36)
MCV: 99 fL — ABNORMAL HIGH (ref 79–95)
Mono # K/uL: 0.2 10*3/uL (ref 0.2–0.9)
Monocyte %: 6.7 %
Neut # K/uL: 2.1 10*3/uL (ref 1.6–6.1)
Nucl RBC # K/uL: 0 10*3/uL (ref 0.0–0.0)
Nucl RBC %: 0 /100 WBC (ref 0.0–0.2)
Platelets: 163 10*3/uL (ref 160–370)
RBC: 4.4 MIL/uL (ref 3.9–5.2)
RDW: 11.5 % — ABNORMAL LOW (ref 11.7–14.4)
Seg Neut %: 58.5 %
WBC: 3.6 10*3/uL — ABNORMAL LOW (ref 4.0–10.0)

## 2015-04-22 LAB — COMPREHENSIVE METABOLIC PANEL
ALT: 34 U/L (ref 0–35)
AST: 37 U/L — ABNORMAL HIGH (ref 0–35)
Albumin: 4.7 g/dL (ref 3.5–5.2)
Alk Phos: 49 U/L (ref 35–105)
Anion Gap: 16 (ref 7–16)
Bilirubin,Total: 1.3 mg/dL — ABNORMAL HIGH (ref 0.0–1.2)
CO2: 24 mmol/L (ref 20–28)
Calcium: 9.2 mg/dL (ref 9.0–10.4)
Chloride: 100 mmol/L (ref 96–108)
Creatinine: 0.61 mg/dL (ref 0.51–0.95)
GFR,Black: 146 *
GFR,Caucasian: 126 *
Glucose: 60 mg/dL (ref 60–99)
Lab: 7 mg/dL (ref 6–20)
Potassium: 4.5 mmol/L (ref 3.3–5.1)
Sodium: 140 mmol/L (ref 133–145)
Total Protein: 7.1 g/dL (ref 6.3–7.7)

## 2015-04-22 LAB — GLUC COMMENT

## 2015-04-22 LAB — MAGNESIUM: Magnesium: 1.7 mEq/L (ref 1.3–2.1)

## 2015-04-23 LAB — BILIRUBIN, DIRECT: Bilirubin,Direct: 0.3 mg/dL (ref 0.0–0.3)

## 2015-04-27 LAB — VITAMIN D
25-OH VIT D2: <4 ng/mL
25-OH VIT D3: 39 ng/mL
25-OH Vit Total: 39 ng/mL (ref 30–60)

## 2015-05-13 ENCOUNTER — Other Ambulatory Visit: Payer: Self-pay | Admitting: Internal Medicine

## 2015-05-31 ENCOUNTER — Ambulatory Visit: Admission: RE | Admit: 2015-05-31 | Payer: Self-pay | Source: Ambulatory Visit | Admitting: Internal Medicine

## 2015-06-07 ENCOUNTER — Telehealth: Payer: Self-pay | Admitting: Otolaryngology

## 2015-06-07 NOTE — Telephone Encounter (Signed)
Patient can see me on Monday 12/12 or can possibly see Todd before then if he has availablitiy.  Thanks, Jonny RuizJohn

## 2015-06-07 NOTE — Telephone Encounter (Signed)
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  Patient does have upcoming performances.  Please advise.    Thank you.

## 2015-06-08 NOTE — Telephone Encounter (Addendum)
Spoke with pt who reports that she has been having vocal spasms intermittently since her last appt in march she states that these episodes comes in waves that seem to last about 2 weeks she is unable to determine anything that exacerbates or relives symptoms. Pt believes she has reflux, she reports nausea, increase in belching, increase in mucous production, denies globus sensation, cough, abdominal pain. Pt states that she has tried to treat the reflux with over the counter medications, she states that she had been taking over the counter Ranitidine at bedtime for about 2 months then she stopped taking Ranitidine and started taking Omeprazole 5 days ago. I made appt for pt to see Dr. Ernestine McmurrayIngle on Monday at 06/13/15 at 2:00 pm. Pt verbalizes understanding.

## 2015-06-08 NOTE — Telephone Encounter (Signed)
Left message for pt to call back  °

## 2015-06-08 NOTE — Telephone Encounter (Signed)
Agree with plan. Thank you

## 2015-06-13 ENCOUNTER — Ambulatory Visit: Payer: Self-pay | Admitting: Otolaryngology

## 2015-06-13 ENCOUNTER — Encounter: Payer: Self-pay | Admitting: Otolaryngology

## 2015-06-13 VITALS — BP 114/71 | HR 74 | Temp 99.0°F | Ht 63.0 in | Wt 105.0 lb

## 2015-06-13 DIAGNOSIS — R490 Dysphonia: Secondary | ICD-10-CM

## 2015-06-13 DIAGNOSIS — J383 Other diseases of vocal cords: Secondary | ICD-10-CM

## 2015-06-13 DIAGNOSIS — K219 Gastro-esophageal reflux disease without esophagitis: Secondary | ICD-10-CM

## 2015-06-13 NOTE — Procedures (Signed)
Tanya Burton DOB: September 16, 1990 Date of Service: 06/13/2015     Physician: Diamond NickelJohn W Al Bracewell, MD     Strobovideolaryngoscopy flexible [phone]([id number])    Pre- Procedure Diagnosis: dysphonia and right mid-/posterior true vocal fold subepithelial fullness, allergic rhinitis, patient has reported vocal spasms     Post-Procedure Diagnosis: same as pre-op    Right mid-/posterior true vocal fold subepithelial lesion, benign, appears broad based and slightly enlarged at today's visit.   No evidence of a neurologic condition of the larynx, no evidence of central tremor of the larynx, no evidence of spasmodic dysphonia.  Normal bilateral true vocal fold motion and function.  Laryngopharyngeal reflux.     Verbal consent for the procedure was obtained from the patient.   While seated, the patient's nasal cavity was topically anesthetized. After adequate anesthesia was obtained, the scope was passed and the nasal cavity, nasopharynx, oropharynx, hypopharynx, and larynx were examined.     Findings:   Supraglottic hyperfunction: none  Right Vocal Fold Movement:   Abduction: normal   Adduction: normal   Longitudinal tension (cricothyroid): normal  Left Vocal Fold Movement:   Abduction: normal  Adduction: normal   Longitudinal tension (cricothyroid): normal  Arytenoid Joint Movement:  normal  Arytenoid Mucosa: normal, minimally erythematous   True Vocal Fold Characterstics: normal, healthy-appearing  Mass(es)/Vibratory Margin Irregularites: right  subepithelial mid vocal fold and posterior vocal fold - appears slightly enlarged at today's visit.   Glottic Closure: complete (normal)  Vocal Process Height: equal  Vibration: (Musculomembranous Vocal Fold)   Phase: symmetric   Periodicity: regular   Wave form: Right normal      Wave form: Left decreased mild  Other Findings: none

## 2015-06-13 NOTE — Progress Notes (Signed)
Presenting Concerns:  Persistent vocal spasms    History of Present Illness:  Tanya Burton is a 24 y.o. 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 She presents with concerns that her vocal spasms have not improved since her last visit nine months ago. The spasms seem to occur in her passagio area and typically happen when she is warming up and first thing in the morning. She localizes the spasm to her thyroid notch. She reports that although others are not able to identify the spasm, she is able to feel it and knows that it is present. She recently re-visited a Scientist, research (life sciences)vocal coach in her home town who feels that her symptoms may be consistent with a hypersensitive throat. She finds symptomatic relief with ginger tea alone as opposed to other teas. She is unable to identify exacerbating factors. There is no pattern as to what causes the spasms and she is unable to predict what factors worsen the quality.    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 She occasionally experiences vocal fatigue with what may be described as "boderline pain" with voice use. She denies hoarseness and has not had aphonic breaks or a total loss of voice. She finds that she has to pace herself more frequently when singing to overcome known barriers in singing.     She feels that her symptoms associated with reflux have gradually become worse. She frequently feels regurgitation and has had an increased in burping. She reports following the guidelines for reflux and avoids certain foods and doesn't eat past 5 o'clock at night. She finds that her symptoms will occur, despite having an empty stomach. She feels that this may have been in correlation with her recent increased stress levels. She has not previously taken a  proton pump inhibitor and is interested in starting a medication if the strobe exam demonstrates significant enough findings.     She presents today for reevaluation. She denies any other interval changes in her medical history. She is not taking any new medications.    Laryngology-related quality of life measures at today's visit:  VHI-10: 13  RSI: 15  DI: 0  CSI: 0  EAT-10: 0  SVHI-10: 16    Recent History:  She reports that 3 weeks ago she suddenly developed "a weird vocal spasm" which occurs sometimes when she is singing.  She reports that the spasm seems to occur in her passagio area.  She reports that the spasm tends to occur while she is warming up.  She reports that she also feels a "catch" in her speaking voice sometimes.  She reports that the spasm is not painful, but feels like a "muscle tweak".  She denies any recent illnesses.  She denies having any vocal hoarseness.  She denies any recent changes in her vocal workload.  She reports that she has not noticed any increased allergy symptoms recently.  She reports that using a facial steamer and drinking more water provide some relief.  She reports that she has been taking Zoloft 25 mg and wonders if this is causing increased vocal dryness.  She also reports that she has been trying to gain weight and may have been having more reflux recently.  She reports that her voice teacher does not have any concerns about  her voice.  She reports making "huge technical strides in [her vocal] range" this year.    Review of Systems:  ROS per patient questionnaire and per patient.  Reviewed and confirmed.  Details include:  Positive for vocal spasm, vocal fatigue, pain with voice use, regurgitation, and increased burping.   Negative for trouble swallowing, trouble breathing, fevers, sweats, chills, and chest pain.   All other systems negative.     Past Medical, Surgical, Family, Social History:  PFSH per patient questionnaire and per patient.  Reviewed and confirmed.   Details include:  Medical history significant for anxiety and a previous vocal cord polyp.  Past surgical history significant for vocal fold surgery with excision of the polyp at [facility] with Dr. Lenard Simmer.  Family history significant for anxiety, cancer, heart failure, heart disease, hypertension.  Refer to HPI for full social history.    Physical Exam:  Constitutional: appears comfortable, no apparent distress.   Communication Assessment: voice with mild hoarseness, no breathiness, articulation clear.   Head: normocephalic, atraumatic, no asymmetry.   Eyes: pupils equal, round and reactive to light and accommodation. Extraocular movements intact, no gaze asymmetry.   Ears: normal external appearance of the pinnae, ear canals patent, tympanic membranes intact, no evidence of middle ear effusion bilaterally.    Nose: normal external appearance, septum fairly midline, nasal passages patent bilaterally with crusted blood anteriorly, turbinates appear normal, no evidence of obvious nasal polyps, nasal mucosa normal.  Oral Cavity: no obvious oral mucosal lesions, normal tongue mobility, gingiva healthy.  Pharynx: no erythema or exudates, no suspicious lesions, normal palate elevation with ah.  Neck: supple, symmetric and free of any obvious masses or tenderness to palpation, trachea and larynx midline, thyroid gland normal to palpation, normal laryngeal mobility to manipulation, no evidence of strap muscle tenderness.  Lymphatic: no evidence of cervical lymphadenopathy to palpation.   Respiratory: respirations non-labored, breathing comfortably, no tachypnea, no stridor or obvious wheezing.   Cardiovascular: no extremity edema, no jugular venous distension.  Musculoskeletal: no obvious deformity of the extremities, gait normal.   Skin: warm, pink, and dry, no obvious rashes of the head and neck.   Neurologic:   Cranial nerves II through XII grossly intact bilaterally.  Psychiatric: normal mood and affect.      After review of the patients chief complaint, history and physical exam findings, the flexible laryngoscopy with stroboscopy procedure was indicated at this visit for a complete evaluation.  Please see separate procedure note.     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 The benign appearing lesion appears broad based and enlarged at today's visit based on stroboscopy. I recommended conservative treatment with a period of observation. She has previously not adequately responded to a course of speaking voice therapy and we may consider a surgical intervention in the near future.     The patient demonstrates symptoms of belching and regurgitation and laryngeal findings that are highly suggestive for laryngopharyngeal reflux, which is a new diagnosis at this visit.  I recommended adequate treatment for laryngopharyngeal reflux.    Recommendations:  An empiric trial of anti-reflux therapy. I have prescribed omeprazole  by mouth, once daily, 30 to 60 minutes before breakfast.  The proper timing with meals with omeprazole is very important.  The breakfast meal should contain a source of protein such as yogurt, lentils, beans, cheese, ect. -  to help the medication work better.  I have also prescribed ranitidine  by mouth once daily at bedtime.  The treatment of laryngopharyngeal reflux requires high-dose, twice daily dosing.  I would like the patient to stay on these medications daily, for a solid 3 months, to see if they make a difference in the patient's symptoms.  We have provided customized dietary and lifestyle modifications counseling to the patient for the reduction and prevention of laryngopharyngeal reflux.  A hand-out detailing these recommendations was also provided.  The patient has agreed to institute these changes.   Continue home voice therapy  exercises.   Return to the [facility] Center in 3 months, or sooner if needed.     It is my pleasure to participate in the care of Tanya Burton.    Sincerely, Donato Schultz. Ernestine Mcmurray, MD  Assistant Professor, Laryngology - Voice, Airway, and Swallowing Disorders  Director, [facility]  Department of Otolaryngology  UR Medicine  Clinic Phone [phone]  Clinic Fax [phone]      Attestations:  Documented by Kenton Kingfisher acting as a scribe for Dr. Ellin Mayhew. Sandip Power  06/13/15  2:46 PM     All medical record entries made by the Scribe were at my direction and personally dictated by me. I have reviewed the chart and agree that the record accurately reflects my personal performance of the history, physical exam, assessment and plan. I have also personally directed, reviewed, and agree with the discharge instructions.  06/13/15  2:46 PM

## 2015-06-16 ENCOUNTER — Encounter: Payer: Self-pay | Admitting: Otolaryngology

## 2015-06-16 MED ORDER — RANITIDINE HCL 300 MG PO TABS *A*
300.0000 mg | ORAL_TABLET | Freq: Every evening | ORAL | 3 refills | Status: AC
Start: 2015-06-16 — End: ?

## 2015-06-16 MED ORDER — OMEPRAZOLE 40 MG PO CPDR *I*
40.0000 mg | DELAYED_RELEASE_CAPSULE | Freq: Every day | ORAL | 3 refills | Status: AC
Start: 2015-06-16 — End: ?

## 2015-08-03 ENCOUNTER — Ambulatory Visit: Payer: Self-pay | Admitting: Otolaryngology

## 2015-08-09 ENCOUNTER — Telehealth: Payer: Self-pay

## 2015-08-09 ENCOUNTER — Ambulatory Visit: Payer: Self-pay

## 2015-08-09 NOTE — Telephone Encounter (Signed)
Patient called to cancel appointment today, she is sick.

## 2015-08-25 ENCOUNTER — Telehealth: Payer: Self-pay | Admitting: Otolaryngology

## 2015-08-25 DIAGNOSIS — R490 Dysphonia: Secondary | ICD-10-CM

## 2015-08-25 DIAGNOSIS — K219 Gastro-esophageal reflux disease without esophagitis: Secondary | ICD-10-CM

## 2015-08-25 NOTE — Telephone Encounter (Signed)
Patient is calling because she is still having issues with her reflux. She is wondering if Dr Ernestine Mcmurray can send a referral over to Dr Quincy Simmonds at [facility].

## 2015-08-25 NOTE — Telephone Encounter (Signed)
I agree with the plan for an evaluation with Dr. Teresa Pelton

## 2015-08-26 ENCOUNTER — Telehealth: Payer: Self-pay | Admitting: Gastroenterology

## 2015-08-26 NOTE — Telephone Encounter (Signed)
Ms. Matlack is calling to check the status of her appointment request. Please return her call at [phone] to discuss.    Dx:   R49.0 (ICD-10-CM) - Dysphonia   J38.7 (ICD-10-CM) - Laryngopharyngeal reflux (LPR)   K21.9 (ICD-10-CM) - Gastroesophageal reflux disease, esophagitis presence not specified

## 2015-08-26 NOTE — Telephone Encounter (Signed)
Called patient and informed her referral is currently under review.

## 2015-08-26 NOTE — Telephone Encounter (Signed)
Sheryl, mother of Tanya Burton, is calling to schedule an appointment with Dr. Teresa Pelton. The patient would like to be seen for NPV/Dysphonia/Larynopharyngeal reflux/Gastroesphagael reflux disease. Please Sheryl back to schedule at [phone].

## 2015-09-02 ENCOUNTER — Ambulatory Visit
Admission: RE | Admit: 2015-09-02 | Discharge: 2015-09-02 | Disposition: A | Discharge status: Home / Self Care | Payer: Self-pay | Source: Ambulatory Visit | Attending: Internal Medicine | Admitting: Internal Medicine

## 2015-09-02 ENCOUNTER — Other Ambulatory Visit: Payer: Self-pay | Admitting: Internal Medicine

## 2015-09-02 LAB — CBC AND DIFFERENTIAL
Baso # K/uL: 0 10*3/uL (ref 0.0–0.1)
Basophil %: 0.7 %
Eos # K/uL: 0 10*3/uL (ref 0.0–0.4)
Eosinophil %: 0.9 %
Hematocrit: 44 % (ref 34–45)
Hemoglobin: 15.1 g/dL (ref 11.2–15.7)
IMM Granulocytes #: 0 10*3/uL (ref 0.0–0.1)
IMM Granulocytes: 0 %
Lymph # K/uL: 1.6 10*3/uL (ref 1.2–3.7)
Lymphocyte %: 36.6 %
MCH: 33 pg/cell — ABNORMAL HIGH (ref 26–32)
MCHC: 34 g/dL (ref 32–36)
MCV: 95 fL (ref 79–95)
Mono # K/uL: 0.3 10*3/uL (ref 0.2–0.9)
Monocyte %: 6.1 %
Neut # K/uL: 2.4 10*3/uL (ref 1.6–6.1)
Nucl RBC # K/uL: 0 10*3/uL (ref 0.0–0.0)
Nucl RBC %: 0 /100 WBC (ref 0.0–0.2)
Platelets: 188 10*3/uL (ref 160–370)
RBC: 4.6 MIL/uL (ref 3.9–5.2)
RDW: 11.9 % (ref 11.7–14.4)
Seg Neut %: 55.7 %
WBC: 4.3 10*3/uL (ref 4.0–10.0)

## 2015-09-02 LAB — LIPID PANEL
Chol/HDL Ratio: 2.5
Cholesterol: 196 mg/dL
HDL: 77 mg/dL
LDL Calculated: 110 mg/dL
Non HDL Cholesterol: 119 mg/dL
Triglycerides: 47 mg/dL

## 2015-09-02 LAB — T4, FREE: Free T4: 1.3 ng/dL (ref 0.9–1.7)

## 2015-09-02 LAB — TSH: TSH: 1.94 u[IU]/mL (ref 0.27–4.20)

## 2015-09-02 NOTE — Telephone Encounter (Signed)
Called patient to schedule NPV on a Friday with Dr. Teresa PeltoneCross. No answer. Mailbox was full.

## 2015-09-03 LAB — COMPREHENSIVE METABOLIC PANEL
ALT: 18 U/L (ref 0–35)
AST: 22 U/L (ref 0–35)
Albumin: 4.8 g/dL (ref 3.5–5.2)
Alk Phos: 46 U/L (ref 35–105)
Anion Gap: 17 — ABNORMAL HIGH (ref 7–16)
Bilirubin,Total: 1.3 mg/dL — ABNORMAL HIGH (ref 0.0–1.2)
CO2: 23 mmol/L (ref 20–28)
Calcium: 9.2 mg/dL (ref 8.8–10.2)
Chloride: 102 mmol/L (ref 96–108)
Creatinine: 0.64 mg/dL (ref 0.51–0.95)
GFR,Black: 143 *
GFR,Caucasian: 124 *
Glucose: 75 mg/dL (ref 60–99)
Lab: 8 mg/dL (ref 6–20)
Potassium: 4.6 mmol/L (ref 3.3–5.1)
Sodium: 142 mmol/L (ref 133–145)
Total Protein: 7.3 g/dL (ref 6.3–7.7)

## 2015-09-06 ENCOUNTER — Ambulatory Visit
Admission: RE | Admit: 2015-09-06 | Discharge: 2015-09-06 | Disposition: A | Discharge status: Home / Self Care | Payer: Self-pay | Source: Ambulatory Visit | Attending: Internal Medicine | Admitting: Internal Medicine

## 2015-09-06 LAB — H. PYLORI ANTIGEN, STOOL: H. pylori Stool Antigen EIA: 0

## 2015-09-09 ENCOUNTER — Telehealth: Payer: Self-pay | Admitting: Otolaryngology

## 2015-09-09 NOTE — Telephone Encounter (Signed)
Left a voice mail for pt that the CD is ready for her to pick up whenever she comes.

## 2015-09-09 NOTE — Telephone Encounter (Signed)
Patient calling and would like to know if her file of video exams is ready. Patient states that she is suppose to pick them up this morning before 12 PM.     Patient is asking to please be called back @ [phone].

## 2015-09-21 ENCOUNTER — Ambulatory Visit
Admission: RE | Admit: 2015-09-21 | Discharge: 2015-09-21 | Disposition: A | Discharge status: Home / Self Care | Payer: Self-pay | Source: Ambulatory Visit | Attending: Internal Medicine | Admitting: Internal Medicine

## 2015-10-07 ENCOUNTER — Other Ambulatory Visit: Payer: Self-pay | Admitting: Gastroenterology

## 2015-10-14 ENCOUNTER — Ambulatory Visit
Admission: RE | Admit: 2015-10-14 | Discharge: 2015-10-14 | Disposition: A | Discharge status: Home / Self Care | Payer: Self-pay | Source: Ambulatory Visit | Attending: Gastroenterology | Admitting: Gastroenterology

## 2015-10-14 MED ORDER — GADOXETATE DISODIUM 0.25 MOL/L (EOVIST) IV SOLN *I*
5.0000 mL | Freq: Once | INTRAVENOUS | Status: AC
Start: 2015-10-14 — End: 2015-10-14
  Administered 2015-10-14: 5 mL via INTRAVENOUS

## 2015-10-24 ENCOUNTER — Ambulatory Visit
Admission: RE | Admit: 2015-10-24 | Discharge: 2015-10-24 | Disposition: A | Discharge status: Home / Self Care | Payer: BC Managed Care – PPO | Source: Ambulatory Visit | Attending: Internal Medicine | Admitting: Internal Medicine

## 2015-10-24 ENCOUNTER — Other Ambulatory Visit: Payer: Self-pay | Admitting: Internal Medicine

## 2015-10-24 DIAGNOSIS — R0602 Shortness of breath: Secondary | ICD-10-CM

## 2016-01-24 ENCOUNTER — Other Ambulatory Visit: Payer: Self-pay

## 2016-01-24 DIAGNOSIS — R55 Syncope and collapse: Secondary | ICD-10-CM

## 2016-01-25 ENCOUNTER — Ambulatory Visit (HOSPITAL_COMMUNITY): Payer: BC Managed Care – PPO | Attending: Cardiovascular Disease

## 2016-01-25 ENCOUNTER — Other Ambulatory Visit: Payer: Self-pay | Admitting: Internal Medicine

## 2016-01-25 ENCOUNTER — Other Ambulatory Visit (HOSPITAL_COMMUNITY): Payer: Self-pay

## 2016-01-25 ENCOUNTER — Ambulatory Visit (INDEPENDENT_AMBULATORY_CARE_PROVIDER_SITE_OTHER): Payer: BC Managed Care – PPO

## 2016-01-25 DIAGNOSIS — R471 Dysarthria and anarthria: Secondary | ICD-10-CM

## 2016-01-25 DIAGNOSIS — R29898 Other symptoms and signs involving the musculoskeletal system: Secondary | ICD-10-CM

## 2016-01-25 DIAGNOSIS — R55 Syncope and collapse: Secondary | ICD-10-CM

## 2016-01-25 DIAGNOSIS — R202 Paresthesia of skin: Secondary | ICD-10-CM

## 2016-01-26 ENCOUNTER — Ambulatory Visit: Payer: BC Managed Care – PPO | Admitting: Interventional Cardiology

## 2016-01-27 ENCOUNTER — Ambulatory Visit (INDEPENDENT_AMBULATORY_CARE_PROVIDER_SITE_OTHER): Payer: BC Managed Care – PPO | Admitting: Cardiology

## 2016-01-27 ENCOUNTER — Encounter: Payer: Self-pay | Admitting: Cardiology

## 2016-01-27 ENCOUNTER — Encounter (INDEPENDENT_AMBULATORY_CARE_PROVIDER_SITE_OTHER): Payer: Self-pay

## 2016-01-27 VITALS — BP 100/70 | HR 83 | Ht 63.0 in | Wt 109.0 lb

## 2016-01-27 DIAGNOSIS — R55 Syncope and collapse: Secondary | ICD-10-CM | POA: Diagnosis not present

## 2016-01-27 NOTE — Progress Notes (Signed)
Cardiology Office Note    Date:  01/27/2016   ID:  [name], DOB October 22, 1990, MRN [id number]  PCP:  Jasmine Hedger, MD  Cardiologist:   Donato Schultz, MD   Chief Complaint  Patient presents with  . Establish Care    Pre syncope    History of Present Illness:  Jasmine Barker is a 25 y.o. female here for the evaluation of near syncope at the request of Jasmine Barker. Her mother is Dr. Mindi Barker.  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 And a graduate of [employer].   She has been having different episodes of feeling close to fainting and heart racing lightheadedness, nausea. Triggers can be hot weather, hot showers, eating certain foods. Sometimes this can occur twice a week and when they do she can have several episodes in 1 day. Previously she was biking and saw spots and felt like she was going to faint and layed on the ground. She has never completely passed out however. When she gets this he episodes she does not become diaphoretic but she does feel nausea, pallor, lightheadedness. These have been occurring more frequently. Several years ago she had an experience where she nearly fainted after becoming overheated.  Sometimes after eating she will feel nausea, tachycardia and lightheadedness. She said some difficulty with concentration occasional numbness in her hands weakness in her legs. Orthostatics have been done and they are normal with BP sitting 92/60 and standing 118/70.  She has been under increased stress in her life, recently moving to another apartment in [place]. About to start graduate program. She has always been quite thin area she does at times craves salt.  Her mother has noted occasional tachycardia. Her mother is an ER physician at [employer].    Seen at [facility] for second opinion regarding focal mass on abdominal ultrasound.  She has no significant past medical  history. Her only surgery has been for a vocal cord polyp that was removed at [facility] a number of years ago.  She has used Capsacian spray to help blunt the vagal effect for her vocal cord spasm. 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    ECHO 01/25/16: - Left ventricle: The cavity size was normal. Systolic function was   normal. The estimated ejection fraction was in the range of 60%   to 65%. Wall motion was normal; there were no regional wall   motion abnormalities. Left ventricular diastolic function   parameters were normal. - Atrial septum: No defect or patent foramen ovale was identified.  Event monitor 2017:  - pending completion  Past Medical History:  Diagnosis Date  . Allergy   . Anxiety   . Lesion of vocal cord     Past Surgical History:  Procedure Laterality Date  . MICROLARYNGOSCOPY  2015   ENT @ [facility] (vocal clinic)    Current Medications: Outpatient Medications Prior to Visit  Medication Sig Dispense Refill  . Azelastine-Fluticasone (DYMISTA) 137-50 MCG/ACT SUSP Place into the nose.    . metroNIDAZOLE (METROGEL) 0.75 % vaginal gel Place 1 Applicatorful vaginally 2 (two) times daily. 70 g 1   No facility-administered medications prior to visit.      Allergies:   Review of patient's allergies indicates no known allergies.   Social History   Social History  . Marital status: Single    Spouse name: boyfriend  lives in [place]  . Number of children: 0  . Years of education: N/A   Occupational History  . singer    Social History Main Topics  . Smoking status: Never Smoker  . Smokeless tobacco: Never Used  . Alcohol use No  . Drug use: No  . Sexual activity: Yes    Partners: Male    Birth control/ protection: Condom   Other Topics Concern  . None   Social History Narrative   To start graduate school fall 2015. Lives with her parents (her mother is an emergency department physician at [employer] and also  works at [employer]).     Family History:  The patient's family history includes Cancer in her paternal grandfather; Diabetes in her father; Heart disease in her maternal grandmother; Hyperlipidemia in her maternal grandmother and mother; Hypertension in her father, maternal grandmother, and mother; Stroke in her father.   ROS:   Please see the history of present illness.    ROS All other systems reviewed and are negative.   PHYSICAL EXAM:   VS:  BP 100/70 (BP Location: Right Arm, Patient Position: Sitting, Cuff Size: Normal)   Pulse 83   Ht 5\' 3"  (1.6 m)   Wt 109 lb (49.4 kg)   SpO2 96%   BMI 19.31 kg/m    GEN: Thin, in no acute distress  HEENT: normal  Neck: no JVD, carotid bruits, or masses Cardiac: RRR; no murmurs, rubs, or gallops,no edema when sitting on exam table, her heart rate was increased. Respiratory:  clear to auscultation bilaterally, normal work of breathing GI: soft, nontender, nondistended, + BS MS: no deformity or atrophy  Skin: warm and dry, no rash, thin Neuro:  Alert and Oriented x 3, Strength and sensation are intact Psych: euthymic mood, full affect  Wt Readings from Last 3 Encounters:  01/27/16 109 lb (49.4 kg)  01/22/14 108 lb (49 kg)  12/28/13 111 lb (50.3 kg)      Studies/Labs Reviewed:   EKG:  EKG is ordered today.  The ekg ordered today demonstrates 01/19/16-sinus rhythm, mild sinus arrhythmia, normal QT interval 380 ms no evidence of Brugada. Similar to prior EKG.  Recent Labs: No results found for requested labs within last 8760 hours.   Creatinine and sodium were normal. TSH was normal. Cortisol level is pending.  Lipid Panel    Component Value Date/Time   CHOL 164 01/22/2014 1304   TRIG 56 01/22/2014 1304   HDL 55 01/22/2014 1304   CHOLHDL 3.0 01/22/2014 1304   VLDL 11 01/22/2014 1304   LDLCALC 98 01/22/2014 1304    Additional studies/ records that were reviewed today include:  EKGs reviewed, prior office notes reviewed, lab work  reviewed. LDL 98 previously HDL 55.    ASSESSMENT:    1. Pre-syncope      PLAN:  In order of problems listed above:  Presyncope/dysautonomia  - Thankfully, structurally her heart is normal by echocardiogram. Event monitor BuSpar has been reassuring. She is going to complete this. We discussed liberalizing fluid and salt. I do not think that Florinef is necessary. She is going to have cortisol drawn by her internist. Her sodium and potassium levels were normal. We discussed at length the physiology behind the vagus nerve, hyper vagal response, dysautonomia and her constellation of symptoms are consistent with this. Thankfully, over time she should improve and this should resolve. Her EKG is normal as well, no evidence of QT prolongation, no evidence of Brugada syndrome. Prior  to these episodes she is not experiencing any significant palpitations. She has never fainted in the midst of exercise on explainable. We also discussed that weight gain can be helpful for increasing her blood pressure as well. We also discussed POTS syndrome as well which falls into the spectrum of dysautonomia and have given her website information "POTS place".   She does have a family history of heart disease, her mother's brother at age 53 died of sudden cardiac death but he also had an aortic dissection and uncontrolled hypertension. Did not sound like an arrhythmia death. Autopsy showed hypertensive heart. Her grandmother also died age 70 from premature coronary artery disease. She has a brother that is currently healthy.  While she is wearing her event monitor, I've asked her to exercise to see if she is having any dangerous or adverse arrhythmias.  We will let her know the results of her monitoring.    Medication Adjustments/Labs and Tests Ordered: Current medicines are reviewed at length with the patient today.  Concerns regarding medicines are outlined above.  Medication changes, Labs and Tests ordered today  are listed in the Patient Instructions below. Patient Instructions  Medication Instructions:  The current medical regimen is effective;  continue present plan and medications.  Follow-Up: Follow up as needed with Dr Anne Fu.  Postural Orthostatic Tachycardia Syndrome Postural orthostatic tachycardia syndrome (POTS) is an increased heart rate when going from a lying (supine) position to a standing position. The heart rate may increase more than 30 beats per minute (BPM) above its resting rate when going from a lying to a standing position. POTS occurs more frequently in women than in men.  SYMPTOMS  POTS symptoms may be increased in the morning. Symptoms of POTS include:  Fainting or near fainting.  Inability to think clearly.  Extreme or chronic fatigue.  Exercise intolerance.  Chest pain.  Having the lower legs develop a reddish-blue color due to decreased blood flow (acrocyanosis). CAUSES POTS can be caused by different conditions. Sometimes, it has no known cause (idiopathic). Some causes of POTS include:  Viral illness.  Pregnancy.  Autoimmune diseases.  Medications.  Major surgery.  Trauma such as a car accident or major injury.  Medical conditions such as anemia, dehydration, and hyperthyroidism. DIAGNOSIS  POTS is diagnosed by:  Taking a complete history and physical exam.  Measuring the heart rate while lying and then upon standing.  Measuring blood pressure when going from a lying to a standing position. POTS is usually not associated with low blood pressure (orthostatic hypotension) when going from a lying to standing position. While standing, blood pressure should be taken 2, 5, and 10 minutes after getting up. TREATMENT  Treatment of POTS depends upon the severity of the symptoms. Treatment includes:  Drinking plenty of fluids to avoid getting dehydrated.  Avoiding very hot environments to not get overheated.  Increasing your dietary salt intake as  instructed by your caregiver.  Taking different types of medications as prescribed for POTS.  Avoiding some classes of medications such as vasodilators and diuretics. SEEK IMMEDIATE MEDICAL CARE IF  You have severe chest pain that does not go away. Call your local emergency service immediately.  You feel your heart racing or beating rapidly.  You feel like passing out.  You have very confused thinking. MAKE SURE YOU  Understand these instructions.  Will watch your condition.  Will get help right away if you are not doing well or get worse.   This information is not  intended to replace advice given to you by your health care provider. Make sure you discuss any questions you have with your health care provider.   Document Released: 06/08/2002 Document Revised: 07/09/2014 Document Reviewed: 08/16/2010  Elsevier Interactive Patient Education 2016 [employer].   Thank you for choosing [facility]         Signed, Donato Schultz, MD  01/27/2016 12:26 PM    [facility] [address] [place], [place], [place]  [address] Phone: [phone]; Fax: [phone]

## 2016-01-27 NOTE — Patient Instructions (Signed)
Medication Instructions:  The current medical regimen is effective;  continue present plan and medications.  Follow-Up: Follow up as needed with Dr Anne Fu.  Postural Orthostatic Tachycardia Syndrome Postural orthostatic tachycardia syndrome (POTS) is an increased heart rate when going from a lying (supine) position to a standing position. The heart rate may increase more than 30 beats per minute (BPM) above its resting rate when going from a lying to a standing position. POTS occurs more frequently in women than in men.  SYMPTOMS  POTS symptoms may be increased in the morning. Symptoms of POTS include:  Fainting or near fainting.  Inability to think clearly.  Extreme or chronic fatigue.  Exercise intolerance.  Chest pain.  Having the lower legs develop a reddish-blue color due to decreased blood flow (acrocyanosis). CAUSES POTS can be caused by different conditions. Sometimes, it has no known cause (idiopathic). Some causes of POTS include:  Viral illness.  Pregnancy.  Autoimmune diseases.  Medications.  Major surgery.  Trauma such as a car accident or major injury.  Medical conditions such as anemia, dehydration, and hyperthyroidism. DIAGNOSIS  POTS is diagnosed by:  Taking a complete history and physical exam.  Measuring the heart rate while lying and then upon standing.  Measuring blood pressure when going from a lying to a standing position. POTS is usually not associated with low blood pressure (orthostatic hypotension) when going from a lying to standing position. While standing, blood pressure should be taken 2, 5, and 10 minutes after getting up. TREATMENT  Treatment of POTS depends upon the severity of the symptoms. Treatment includes:  Drinking plenty of fluids to avoid getting dehydrated.  Avoiding very hot environments to not get overheated.  Increasing your dietary salt intake as instructed by your caregiver.  Taking different types of medications  as prescribed for POTS.  Avoiding some classes of medications such as vasodilators and diuretics. SEEK IMMEDIATE MEDICAL CARE IF  You have severe chest pain that does not go away. Call your local emergency service immediately.  You feel your heart racing or beating rapidly.  You feel like passing out.  You have very confused thinking. MAKE SURE YOU  Understand these instructions.  Will watch your condition.  Will get help right away if you are not doing well or get worse.   This information is not intended to replace advice given to you by your health care provider. Make sure you discuss any questions you have with your health care provider.   Document Released: 06/08/2002 Document Revised: 07/09/2014 Document Reviewed: 08/16/2010  Elsevier Interactive Patient Education 2016 [employer].   Thank you for choosing [facility]!!

## 2016-02-02 ENCOUNTER — Ambulatory Visit
Admission: RE | Admit: 2016-02-02 | Discharge: 2016-02-02 | Disposition: A | Discharge status: Home / Self Care | Payer: BC Managed Care – PPO | Source: Ambulatory Visit | Attending: Internal Medicine | Admitting: Internal Medicine

## 2016-02-02 DIAGNOSIS — R202 Paresthesia of skin: Secondary | ICD-10-CM

## 2016-02-02 DIAGNOSIS — R471 Dysarthria and anarthria: Secondary | ICD-10-CM

## 2016-02-02 DIAGNOSIS — R29898 Other symptoms and signs involving the musculoskeletal system: Secondary | ICD-10-CM

## 2016-02-02 MED ORDER — GADOBENATE DIMEGLUMINE 529 MG/ML IV SOLN
9.0000 mL | Freq: Once | INTRAVENOUS | Status: AC | PRN
  Administered 2016-02-02: 9 mL via INTRAVENOUS

## 2016-03-06 ENCOUNTER — Encounter: Payer: Self-pay | Admitting: *Deleted

## 2017-02-10 IMAGING — MR MR HEAD WO/W CM
9 of 10 series · 38 of 48 positions shown · IV contrast (multihance)
Comparison: None.

CLINICAL DATA: Numbness and heaviness in the hands. Slight head
tremor. dysautonomia.

EXAM:
MRI HEAD WITHOUT AND WITH CONTRAST
TECHNIQUE: Multiplanar, multiecho pulse sequences of the brain and surrounding
structures were obtained without and with intravenous contrast.
CONTRAST:  9mL MULTIHANCE GADOBENATE DIMEGLUMINE 529 MG/ML IV SOLN

[Series 2: T1 · sagittal · 5.0mm · 0.45mm/px · 3 of 19 slices shown]
[im 1/19]
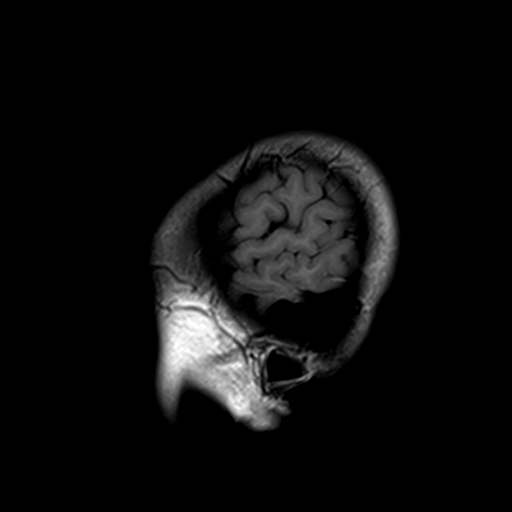
[im 10/19]
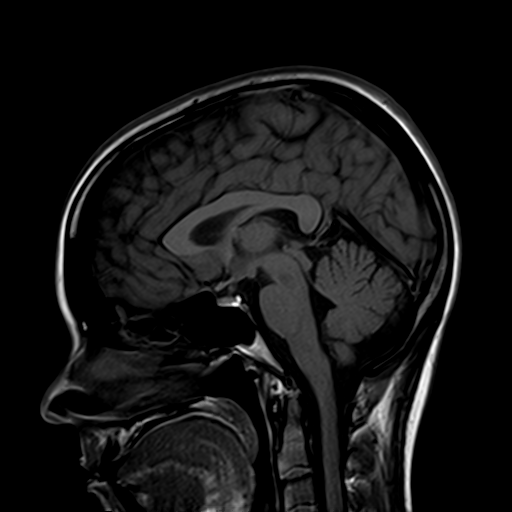
[im 19/19]
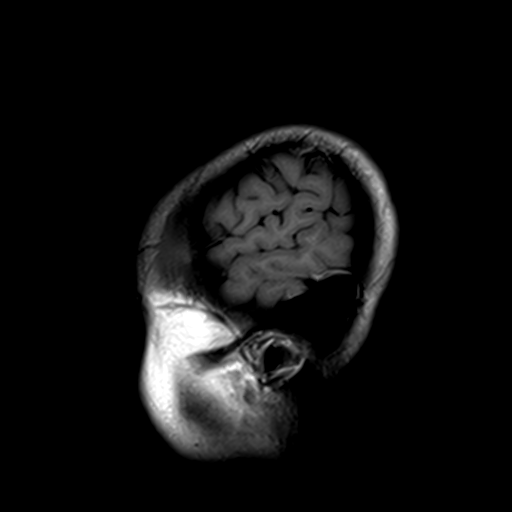

[Series 3: T2 · axial · 5.0mm · 0.51mm/px · z∈[-59,+75]mm · 3 of 22 slices shown (1 of 2)]
[im 1/22]
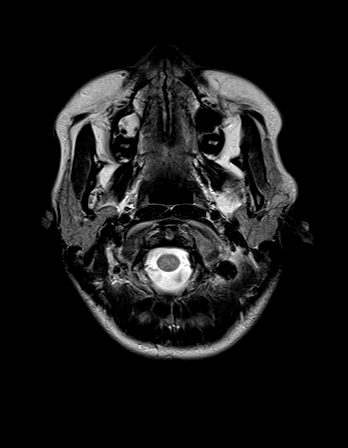
[im 11/22]
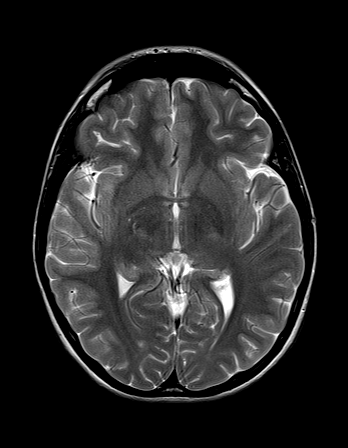
[im 22/22]
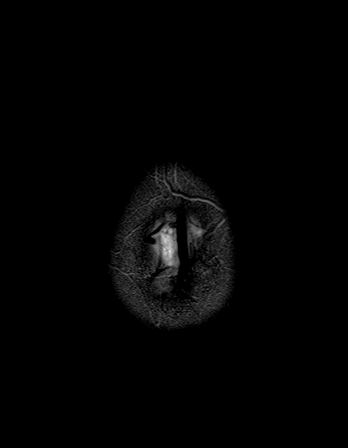

[Series 4: DWI · axial · 3.0mm · 0.94mm/px · z∈[-58,+75]mm · 8 of 92 slices shown]
[im 1/92]
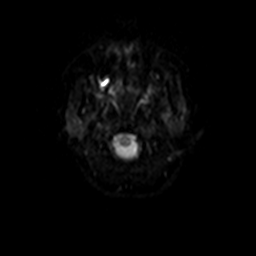
[im 11/92]
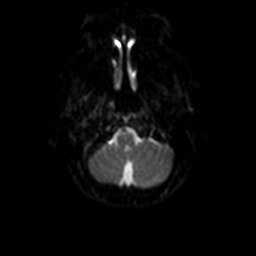
[im 31/92]
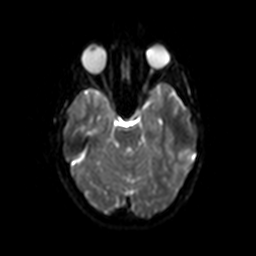
[im 41/92]
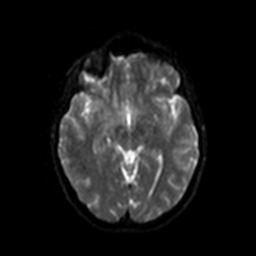
[im 51/92]
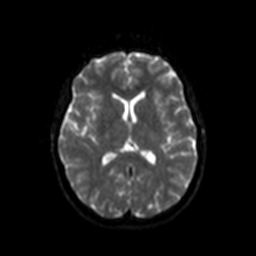
[im 61/92]
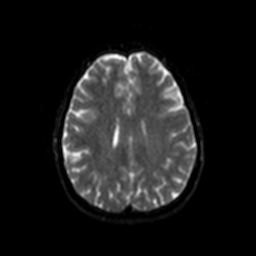
[im 81/92]
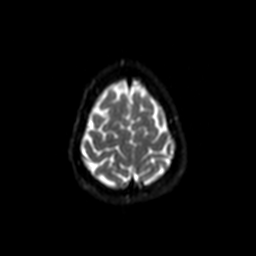
[im 92/92]
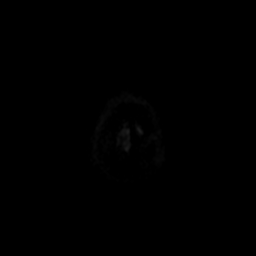

[Series 5: dwi_adc · axial · 3.0mm · 0.94mm/px · z∈[-58,+75]mm · 5 of 46 slices shown]
[im 1/46]
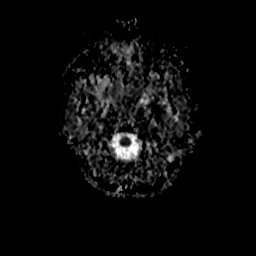
[im 12/46]
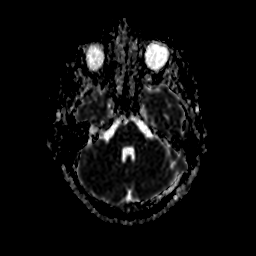
[im 23/46]
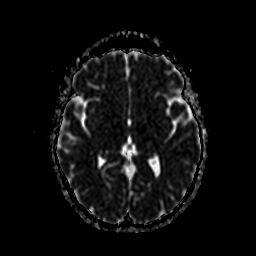
[im 34/46]
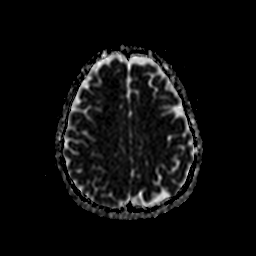
[im 46/46]
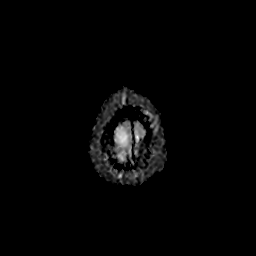

[Series 6: FLAIR · axial · 5.0mm · 0.45mm/px · z∈[-59,+75]mm · 2 of 22 slices shown]
[im 1/22]
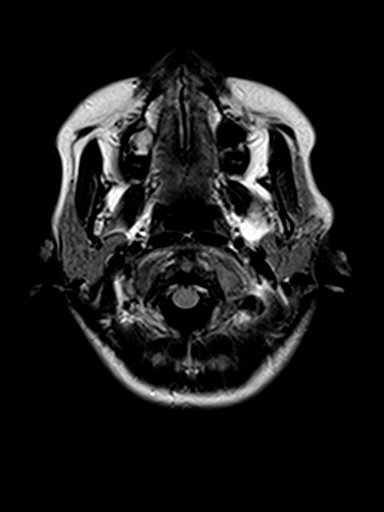
[im 22/22]
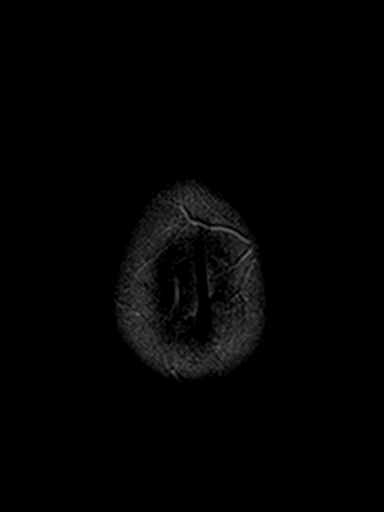

[Series 7: axial (person_name)1 volume · axial · 2.0mm · 0.45mm/px · z∈[-62,+78]mm · 7 of 72 slices shown]
[im 1/72]
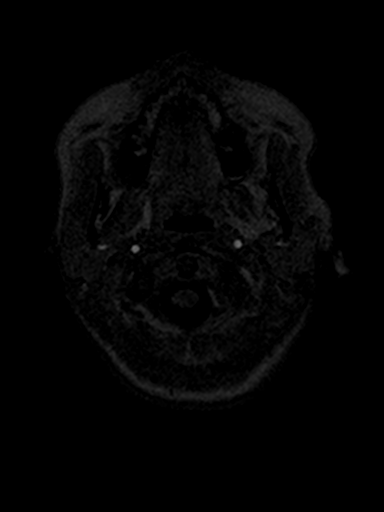
[im 12/72]
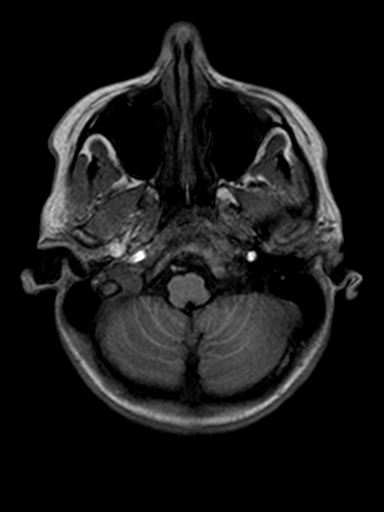
[im 24/72]
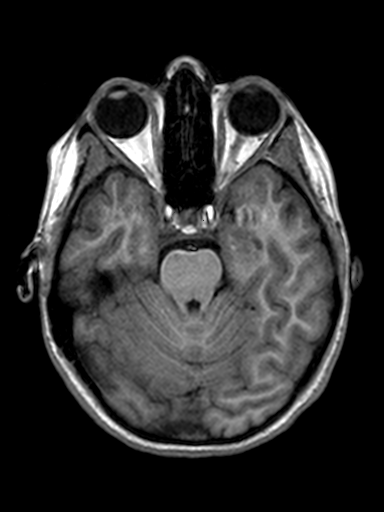
[im 36/72]
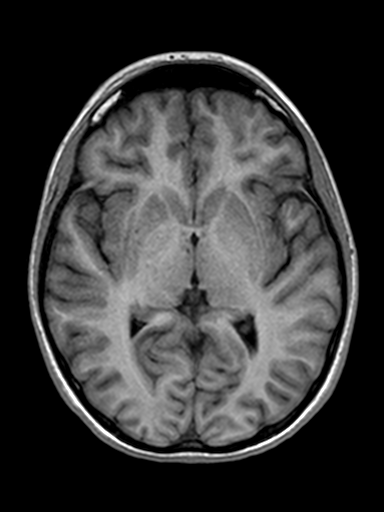
[im 48/72]
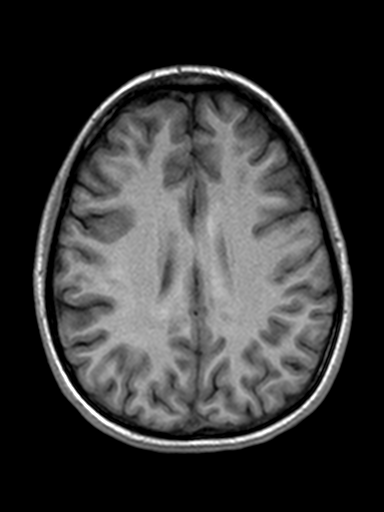
[im 60/72]
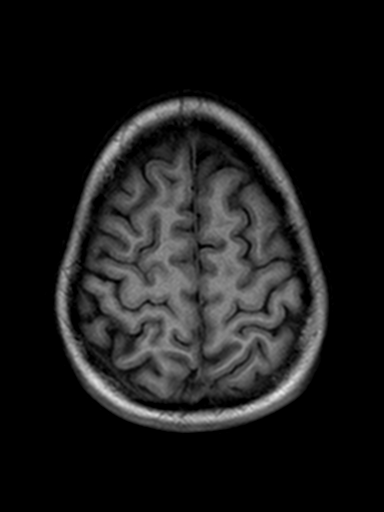
[im 72/72]
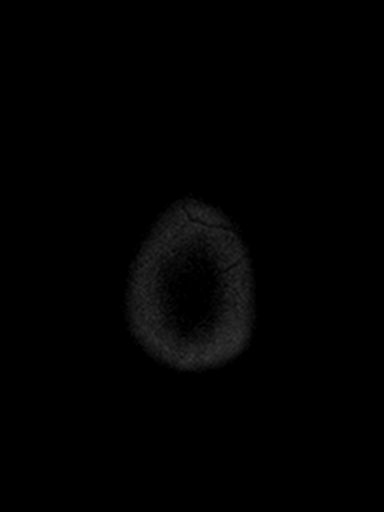

[Series 9: swi_images · axial · 2.0mm · 0.90mm/px · z∈[-62,+78]mm · 7 of 72 slices shown]
[im 1/72]
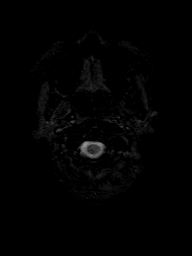
[im 12/72]
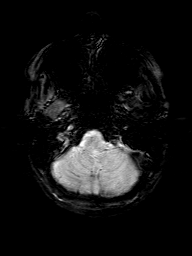
[im 24/72]
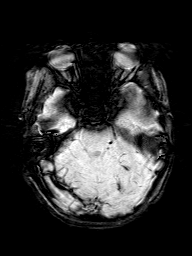
[im 36/72]
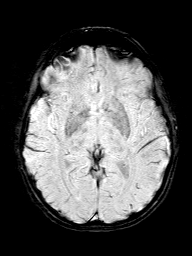
[im 48/72]
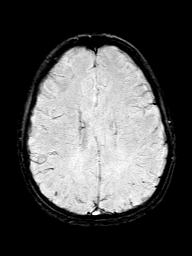
[im 60/72]
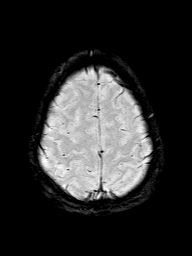
[im 72/72]
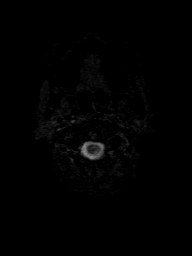

[Series 10: T2 · coronal · 5.0mm · 0.45mm/px · 2 of 24 slices shown (2 of 2)]
[im 1/24]
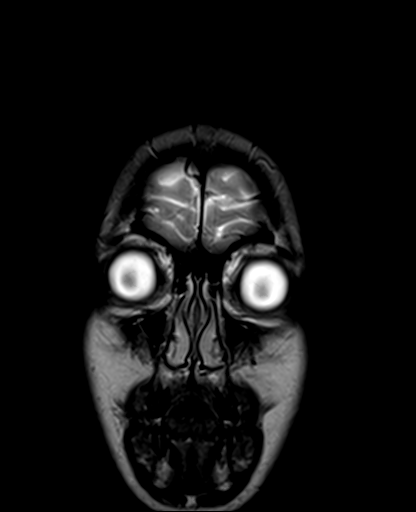
[im 24/24]
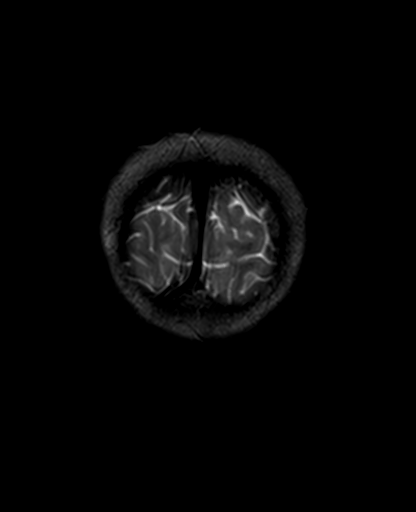

[Series 11: post_axial (person_name) · axial · 2.0mm · 0.45mm/px · 1 of 72 slices shown]
[im 1/72]
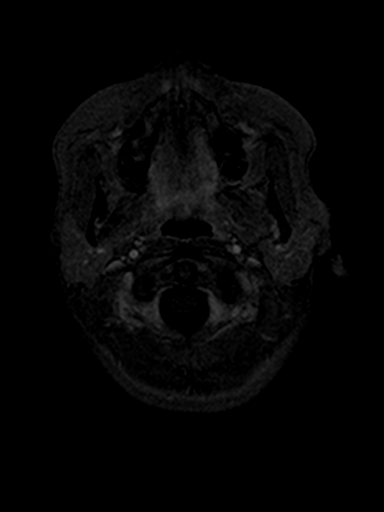

[38 of 48 positions shown; findings below may reference images not displayed]

FINDINGS: No acute infarct, hemorrhage, or mass lesion is present. The
ventricles are of normal size. No significant extraaxial fluid
collection is present.

No significant white matter disease is present. Internal auditory
canals are within normal limits bilaterally. Flow is present in the
major intracranial arteries. The globes and orbits are intact.
Minimal mucosal thickening is noted along the floor of the maxillary
sinuses bilaterally. The paranasal sinuses and the mastoid air cells
are otherwise clear.

Skullbase is within normal limits. Midline sagittal images are
unremarkable.

Postcontrast images demonstrate no pathologic enhancement.
IMPRESSION: 1. Normal MRI appearance of the brain.
2. Minimal inferior maxillary sinus mucosal thickening bilaterally
without other significant sinus disease.

## 8387-03-03 DEATH — deceased
